# Patient Record
Sex: Female | Born: 1990
Health system: Southern US, Community
[De-identification: ages and names within clinical notes are randomized; demographics above are authoritative.]

## PROBLEM LIST (undated history)

## (undated) DIAGNOSIS — S62609A Fracture of unspecified phalanx of unspecified finger, initial encounter for closed fracture: Secondary | ICD-10-CM

## (undated) HISTORY — DX: Fracture of unspecified phalanx of unspecified finger, initial encounter for closed fracture: S62.609A

---
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
## 2012-02-05 ENCOUNTER — Telehealth: Payer: Self-pay | Admitting: Internal Medicine

## 2012-02-05 NOTE — Telephone Encounter (Signed)
Patient's mom called stating that her daughter needs a refill of her benzaclin and appt has already been scheduled for 03/29/12 at 8:30. Please assist.

## 2012-02-06 MED ORDER — CLINDAMYCIN PHOS-BENZOYL PEROX 1-5 % EX GEL: CUTANEOUS | Status: DC

## 2012-02-06 NOTE — Telephone Encounter (Signed)
Rx sent to pharmacy   

## 2012-03-26 ENCOUNTER — Encounter: Payer: Self-pay | Admitting: Internal Medicine

## 2012-03-29 ENCOUNTER — Ambulatory Visit: Payer: Self-pay | Admitting: Internal Medicine

## 2016-11-16 ENCOUNTER — Encounter: Payer: Self-pay | Admitting: Obstetrics and Gynecology

## 2016-11-20 DIAGNOSIS — R87622 Low grade squamous intraepithelial lesion on cytologic smear of vagina (LGSIL): Secondary | ICD-10-CM | POA: Insufficient documentation

## 2019-06-28 DIAGNOSIS — Z0000 Encounter for general adult medical examination without abnormal findings: Secondary | ICD-10-CM | POA: Diagnosis not present

## 2019-06-28 DIAGNOSIS — Z13220 Encounter for screening for lipoid disorders: Secondary | ICD-10-CM | POA: Diagnosis not present

## 2019-06-29 DIAGNOSIS — Z01419 Encounter for gynecological examination (general) (routine) without abnormal findings: Secondary | ICD-10-CM | POA: Diagnosis not present

## 2019-06-29 DIAGNOSIS — Z113 Encounter for screening for infections with a predominantly sexual mode of transmission: Secondary | ICD-10-CM | POA: Diagnosis not present

## 2019-07-22 DIAGNOSIS — Z20828 Contact with and (suspected) exposure to other viral communicable diseases: Secondary | ICD-10-CM | POA: Diagnosis not present

## 2019-07-22 DIAGNOSIS — U071 COVID-19: Secondary | ICD-10-CM | POA: Diagnosis not present

## 2019-08-02 DIAGNOSIS — Z20828 Contact with and (suspected) exposure to other viral communicable diseases: Secondary | ICD-10-CM | POA: Diagnosis not present

## 2019-09-13 DIAGNOSIS — H9193 Unspecified hearing loss, bilateral: Secondary | ICD-10-CM | POA: Diagnosis not present

## 2019-09-13 DIAGNOSIS — J302 Other seasonal allergic rhinitis: Secondary | ICD-10-CM | POA: Diagnosis not present

## 2019-09-13 DIAGNOSIS — J343 Hypertrophy of nasal turbinates: Secondary | ICD-10-CM | POA: Diagnosis not present

## 2019-09-13 DIAGNOSIS — J342 Deviated nasal septum: Secondary | ICD-10-CM | POA: Diagnosis not present

## 2019-10-23 DIAGNOSIS — Z20828 Contact with and (suspected) exposure to other viral communicable diseases: Secondary | ICD-10-CM | POA: Diagnosis not present

## 2019-11-10 DIAGNOSIS — J343 Hypertrophy of nasal turbinates: Secondary | ICD-10-CM | POA: Diagnosis not present

## 2019-11-10 DIAGNOSIS — L700 Acne vulgaris: Secondary | ICD-10-CM | POA: Diagnosis not present

## 2019-11-10 DIAGNOSIS — J342 Deviated nasal septum: Secondary | ICD-10-CM | POA: Diagnosis not present

## 2020-01-26 ENCOUNTER — Ambulatory Visit: Payer: Self-pay | Attending: Family

## 2020-01-26 DIAGNOSIS — Z23 Encounter for immunization: Secondary | ICD-10-CM

## 2020-01-26 NOTE — Progress Notes (Signed)
   Covid-19 Vaccination Clinic  Name:  Laurie Stout    MRN: [id number] DOB: Jan 11, 1991  01/26/2020  Laurie Stout was observed post Covid-19 immunization for 15 minutes without incident. She was provided with Vaccine Information Sheet and instruction to access the V-Safe system.   Laurie Stout was instructed to call 911 with any severe reactions post vaccine: Marland Kitchen Difficulty breathing  . Swelling of face and throat  . A fast heartbeat  . A bad rash all over body  . Dizziness and weakness   Immunizations Administered    Name Date Dose VIS Date Route   Moderna COVID-19 Vaccine 01/26/2020 11:22 AM 0.5 mL 09/27/2019 Intramuscular   Manufacturer: Moderna   Lot: [id number]   NDC: 94707-615-18

## 2020-02-07 ENCOUNTER — Other Ambulatory Visit: Payer: Self-pay

## 2020-02-08 ENCOUNTER — Encounter: Payer: Self-pay | Admitting: Nurse Practitioner

## 2020-02-08 ENCOUNTER — Ambulatory Visit (INDEPENDENT_AMBULATORY_CARE_PROVIDER_SITE_OTHER): Payer: BC Managed Care – PPO

## 2020-02-08 ENCOUNTER — Ambulatory Visit (INDEPENDENT_AMBULATORY_CARE_PROVIDER_SITE_OTHER): Payer: BC Managed Care – PPO | Admitting: Nurse Practitioner

## 2020-02-08 VITALS — BP 122/88 | HR 78 | Temp 97.3°F | Ht 66.0 in | Wt 213.0 lb

## 2020-02-08 DIAGNOSIS — N911 Secondary amenorrhea: Secondary | ICD-10-CM

## 2020-02-08 DIAGNOSIS — M545 Low back pain, unspecified: Secondary | ICD-10-CM | POA: Insufficient documentation

## 2020-02-08 DIAGNOSIS — N912 Amenorrhea, unspecified: Secondary | ICD-10-CM | POA: Insufficient documentation

## 2020-02-08 DIAGNOSIS — G8929 Other chronic pain: Secondary | ICD-10-CM | POA: Insufficient documentation

## 2020-02-08 LAB — TSH: TSH: 3.43 u[IU]/mL (ref 0.35–4.50)

## 2020-02-08 LAB — CBC WITH DIFFERENTIAL/PLATELET
Basophils Absolute: 0.1 10*3/uL (ref 0.0–0.1)
Basophils Relative: 0.8 % (ref 0.0–3.0)
Eosinophils Absolute: 0.1 10*3/uL (ref 0.0–0.7)
Eosinophils Relative: 1.9 % (ref 0.0–5.0)
HCT: 41.6 % (ref 36.0–46.0)
Hemoglobin: 14.3 g/dL (ref 12.0–15.0)
Lymphocytes Relative: 37.6 % (ref 12.0–46.0)
Lymphs Abs: 2.5 10*3/uL (ref 0.7–4.0)
MCHC: 34.3 g/dL (ref 30.0–36.0)
MCV: 82.1 fl (ref 78.0–100.0)
Monocytes Absolute: 0.6 10*3/uL (ref 0.1–1.0)
Monocytes Relative: 9.1 % (ref 3.0–12.0)
Neutro Abs: 3.4 10*3/uL (ref 1.4–7.7)
Neutrophils Relative %: 50.6 % (ref 43.0–77.0)
Platelets: 187 10*3/uL (ref 150.0–400.0)
RBC: 5.06 Mil/uL (ref 3.87–5.11)
RDW: 13.1 % (ref 11.5–15.5)
WBC: 6.6 10*3/uL (ref 4.0–10.5)

## 2020-02-08 LAB — COMPREHENSIVE METABOLIC PANEL
ALT: 39 U/L — ABNORMAL HIGH (ref 0–35)
AST: 22 U/L (ref 0–37)
Albumin: 4.6 g/dL (ref 3.5–5.2)
Alkaline Phosphatase: 57 U/L (ref 39–117)
BUN: 13 mg/dL (ref 6–23)
CO2: 26 mEq/L (ref 19–32)
Calcium: 9.3 mg/dL (ref 8.4–10.5)
Chloride: 104 mEq/L (ref 96–112)
Creatinine, Ser: 0.78 mg/dL (ref 0.40–1.20)
GFR: 88.44 mL/min (ref >60.00)
Glucose, Bld: 94 mg/dL (ref 70–99)
Potassium: 4.1 mEq/L (ref 3.5–5.1)
Sodium: 138 mEq/L (ref 135–145)
Total Bilirubin: 0.5 mg/dL (ref 0.2–1.2)
Total Protein: 6.9 g/dL (ref 6.0–8.3)

## 2020-02-08 LAB — FOLLICLE STIMULATING HORMONE: FSH: 7.5 m[IU]/mL

## 2020-02-08 LAB — PROLACTIN: Prolactin: 9 ng/mL

## 2020-02-08 LAB — LUTEINIZING HORMONE: LH: 16 m[IU]/mL

## 2020-02-08 NOTE — Progress Notes (Signed)
Subjective:  Patient ID: Laurie Stout, female    DOB: [birth year]  Age: 29 y.o. MRN: [id number]  CC: Establish Care (New Pt-Back Pain-2 months ago bend down and tearing sensation and getting worse)  Back Pain This is a chronic problem. The current episode started more than 1 year ago. The problem occurs intermittently. The problem has been waxing and waning since onset. The pain is present in the lumbar spine. The pain does not radiate. The pain is moderate. The pain is the same all the time. The symptoms are aggravated by bending and sitting. Stiffness is present all day. Pertinent negatives include no abdominal pain, bladder incontinence, bowel incontinence, dysuria, fever, headaches, leg pain, numbness, paresis, paresthesias, pelvic pain, perianal numbness, tingling, weakness or weight loss. Risk factors include obesity, lack of exercise and poor posture. She has tried muscle relaxant and NSAIDs for the symptoms. The treatment provided no relief.  denies any previous or recent back injury  She also reports absence of menstrual cycle in 89yrs, hx of irregular menstrual cycles, no hx of STD, no Hx of infertility. Has female partner, never had female partner.  Reviewed past Medical, Social and Family history today.  Outpatient Medications Prior to Visit  Medication Sig Dispense Refill  . fluticasone (FLONASE) 50 MCG/ACT nasal spray Place into the nose.    . clindamycin-benzoyl peroxide (BENZACLIN) gel Apply to face every am for acne. Pt needs to schedule a follow up appt before next refill. (Patient not taking: Reported on 02/08/2020) 50 g 0   No facility-administered medications prior to visit.    ROS See HPI  Objective:  BP 122/88   Pulse 78   Temp (!) 97.3 F (36.3 C) (Tympanic)   Ht 5\' 6"  (1.676 m)   Wt 213 lb (96.6 kg)   SpO2 98%   BMI 34.38 kg/m   BP Readings from Last 3 Encounters:  02/08/20 122/88    Wt Readings from Last 3 Encounters:  02/08/20 213 lb (96.6 kg)     Physical Exam Constitutional:      Appearance: She is obese.  Cardiovascular:     Rate and Rhythm: Normal rate and regular rhythm.     Pulses: Normal pulses.  Pulmonary:     Effort: Pulmonary effort is normal.  Abdominal:     General: Bowel sounds are normal. There is no distension.     Palpations: Abdomen is soft.     Tenderness: There is no abdominal tenderness.  Musculoskeletal:        General: Tenderness present. No swelling.     Thoracic back: Normal.     Lumbar back: Tenderness and bony tenderness present. Decreased range of motion. Negative right straight leg raise test and negative left straight leg raise test. No scoliosis.     Right hip: Normal.     Left hip: Normal.     Right lower leg: Normal. No edema.     Left lower leg: Normal. No edema.  Skin:    General: Skin is warm and dry.     Findings: No erythema or rash.  Neurological:     Mental Status: She is alert.    Lab Results  Component Value Date   WBC 6.6 02/08/2020   HGB 14.3 02/08/2020   HCT 41.6 02/08/2020   PLT 187.0 02/08/2020   GLUCOSE 94 02/08/2020   ALT 39 (H) 02/08/2020   AST 22 02/08/2020   NA 138 02/08/2020   K 4.1 02/08/2020   CL 104 02/08/2020  CREATININE 0.78 02/08/2020   BUN 13 02/08/2020   CO2 26 02/08/2020   TSH 3.43 02/08/2020    Assessment & Plan:  This visit occurred during the SARS-CoV-2 public health emergency.  Safety protocols were in place, including screening questions prior to the visit, additional usage of staff PPE, and extensive cleaning of exam room while observing appropriate contact time as indicated for disinfecting solutions.   Yadira was seen today for establish care.  Diagnoses and all orders for this visit:  Chronic bilateral low back pain without sciatica -     DG Lumbar Spine 2-3 Views -     cyclobenzaprine (FLEXERIL) 10 MG tablet; Take 1 tablet (10 mg total) by mouth at bedtime. -     predniSONE (DELTASONE) 20 MG tablet; Take 1.5 tablets (30 mg  total) by mouth daily with breakfast for 2 days, THEN 1 tablet (20 mg total) daily with breakfast for 3 days, THEN 0.5 tablets (10 mg total) daily with breakfast for 2 days. -     Ambulatory referral to Physical Therapy  Secondary amenorrhea -     CBC w/Diff -     Comprehensive metabolic panel -     TSH -     Prolactin -     FSH -     LH -     US Pelvic Complete With Transvaginal; Future   I have discontinued Marline Backbone Mandala's clindamycin-benzoyl peroxide. I am also having her start on cyclobenzaprine and predniSONE. Additionally, I am having her maintain her fluticasone.  Meds ordered this encounter  Medications  . cyclobenzaprine (FLEXERIL) 10 MG tablet    Sig: Take 1 tablet (10 mg total) by mouth at bedtime.    Dispense:  20 tablet    Refill:  0    Order Specific Question:   Supervising Provider    Answer:   Overton Mam [[id number]]  . predniSONE (DELTASONE) 20 MG tablet    Sig: Take 1.5 tablets (30 mg total) by mouth daily with breakfast for 2 days, THEN 1 tablet (20 mg total) daily with breakfast for 3 days, THEN 0.5 tablets (10 mg total) daily with breakfast for 2 days.    Dispense:  7 tablet    Refill:  0    Order Specific Question:   Supervising Provider    Answer:   Overton Mam [[id number]]    Problem List Items Addressed This Visit      Other   Amenorrhea    absence of menstrual cycle in 64yrs, hx of irregular menstrual cycles, no hx of STD, no FHx of infertility. Has female partner, never had female partner.  Normal TSH, prolactin, LH, FSH, and CMP. Ordered pelvic US Entered referral to GYN      Chronic bilateral low back pain without sciatica - Primary    Chronic, waxing and waning, worse in last 73months, no radiculopathy. Normal Lumbar DG Sent oral prednisone and flexeril Entered referral to PT      Relevant Medications   cyclobenzaprine (FLEXERIL) 10 MG tablet   predniSONE (DELTASONE) 20 MG tablet   Other Relevant Orders   DG Lumbar Spine 2-3  Views (Completed)   Ambulatory referral to Physical Therapy       Follow-up: Return if symptoms worsen or fail to improve.  Alysia Penna, NP

## 2020-02-08 NOTE — Patient Instructions (Signed)
Go to lab for blood draw and back x-ray  You will be contacted with results.

## 2020-02-09 MED ORDER — CYCLOBENZAPRINE HCL 10 MG PO TABS: 10.0000 mg | ORAL_TABLET | Freq: Every day | ORAL | 0 refills | Status: AC

## 2020-02-09 MED ORDER — PREDNISONE 20 MG PO TABS: ORAL_TABLET | ORAL | 0 refills | Status: AC

## 2020-02-09 NOTE — Assessment & Plan Note (Signed)
Chronic, waxing and waning, worse in last 10months, no radiculopathy. Normal Lumbar DG Sent oral prednisone and flexeril Entered referral to PT

## 2020-02-09 NOTE — Assessment & Plan Note (Signed)
absence of menstrual cycle in 46yrs, hx of irregular menstrual cycles, no hx of STD, no FHx of infertility. Has female partner, never had female partner.  Normal TSH, prolactin, LH, FSH, and CMP. Ordered pelvic US Entered referral to GYN

## 2020-02-15 ENCOUNTER — Ambulatory Visit
Admission: RE | Admit: 2020-02-15 | Discharge: 2020-02-15 | Disposition: A | Discharge status: Home / Self Care | Payer: BC Managed Care – PPO | Source: Ambulatory Visit | Attending: Nurse Practitioner | Admitting: Nurse Practitioner

## 2020-02-15 DIAGNOSIS — N858 Other specified noninflammatory disorders of uterus: Secondary | ICD-10-CM | POA: Diagnosis not present

## 2020-02-15 DIAGNOSIS — N911 Secondary amenorrhea: Secondary | ICD-10-CM

## 2020-02-18 ENCOUNTER — Other Ambulatory Visit: Payer: Self-pay | Admitting: Nurse Practitioner

## 2020-02-18 DIAGNOSIS — E282 Polycystic ovarian syndrome: Secondary | ICD-10-CM

## 2020-02-18 DIAGNOSIS — N912 Amenorrhea, unspecified: Secondary | ICD-10-CM

## 2020-02-22 ENCOUNTER — Ambulatory Visit: Payer: BC Managed Care – PPO | Attending: Nurse Practitioner | Admitting: Physical Therapy

## 2020-02-28 ENCOUNTER — Ambulatory Visit: Payer: Self-pay

## 2020-03-02 DIAGNOSIS — Z021 Encounter for pre-employment examination: Secondary | ICD-10-CM | POA: Diagnosis not present

## 2020-03-02 DIAGNOSIS — Z1159 Encounter for screening for other viral diseases: Secondary | ICD-10-CM | POA: Diagnosis not present

## 2020-03-02 DIAGNOSIS — Z111 Encounter for screening for respiratory tuberculosis: Secondary | ICD-10-CM | POA: Diagnosis not present

## 2020-03-06 ENCOUNTER — Ambulatory Visit: Payer: BC Managed Care – PPO | Attending: Family

## 2020-03-06 DIAGNOSIS — Z23 Encounter for immunization: Secondary | ICD-10-CM

## 2020-03-06 NOTE — Progress Notes (Signed)
   Covid-19 Vaccination Clinic  Name:  Laurie Stout    MRN: [id number] DOB: [birth year]  03/06/2020  [name] was observed post Covid-19 immunization for 15 minutes without incident. She was provided with Vaccine Information Sheet and instruction to access the V-Safe system.   [name] was instructed to call 911 with any severe reactions post vaccine: Marland Kitchen Difficulty breathing  . Swelling of face and throat  . A fast heartbeat  . A bad rash all over body  . Dizziness and weakness   Immunizations Administered    Name Date Dose VIS Date Route   Moderna COVID-19 Vaccine 03/06/2020 11:33 AM 0.5 mL 09/2019 Intramuscular   Manufacturer: Moderna   Lot: [id number]   NDC: 99833-825-05

## 2020-03-14 ENCOUNTER — Encounter: Payer: Self-pay | Admitting: Obstetrics and Gynecology

## 2020-03-14 ENCOUNTER — Other Ambulatory Visit: Payer: Self-pay

## 2020-03-14 ENCOUNTER — Other Ambulatory Visit (HOSPITAL_COMMUNITY)
Admission: RE | Admit: 2020-03-14 | Discharge: 2020-03-14 | Disposition: A | Discharge status: Home / Self Care | Payer: BC Managed Care – PPO | Source: Ambulatory Visit | Attending: Obstetrics and Gynecology | Admitting: Obstetrics and Gynecology

## 2020-03-14 ENCOUNTER — Ambulatory Visit: Payer: BC Managed Care – PPO | Admitting: Obstetrics and Gynecology

## 2020-03-14 VITALS — BP 132/89 | HR 68 | Ht 66.0 in | Wt 210.3 lb

## 2020-03-14 DIAGNOSIS — Z113 Encounter for screening for infections with a predominantly sexual mode of transmission: Secondary | ICD-10-CM | POA: Insufficient documentation

## 2020-03-14 DIAGNOSIS — Z01419 Encounter for gynecological examination (general) (routine) without abnormal findings: Secondary | ICD-10-CM | POA: Diagnosis not present

## 2020-03-14 DIAGNOSIS — N898 Other specified noninflammatory disorders of vagina: Secondary | ICD-10-CM

## 2020-03-14 DIAGNOSIS — N911 Secondary amenorrhea: Secondary | ICD-10-CM

## 2020-03-14 MED ORDER — MEDROXYPROGESTERONE ACETATE 10 MG PO TABS: 10.0000 mg | ORAL_TABLET | Freq: Every day | ORAL | 0 refills | Status: AC

## 2020-03-14 NOTE — Patient Instructions (Signed)
Please call and let me know if you have a period after the 10 days of progesterone.

## 2020-03-14 NOTE — Progress Notes (Signed)
Pt is here as new patient for initial visit. Referral from [facility] at [facility].   Pt reports that she has not had a menstrual cycle in over 2 years. Pt reports that her menstrual cycles have never been normal. Pt had [place] and blood work in April.   Pt reports she is up to date on pap smear, last one 2020 pt reports as normal.
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
## 2020-03-15 LAB — CERVICOVAGINAL ANCILLARY ONLY
Bacterial Vaginitis (gardnerella): NEGATIVE
Candida Glabrata: NEGATIVE
Candida Vaginitis: NEGATIVE
Chlamydia: NEGATIVE
Comment: NEGATIVE
Comment: NEGATIVE
Comment: NEGATIVE
Comment: NEGATIVE
Comment: NEGATIVE
Comment: NORMAL
Neisseria Gonorrhea: NEGATIVE
Trichomonas: NEGATIVE

## 2020-03-21 LAB — HEMOGLOBIN A1C
Est. average glucose Bld gHb Est-mCnc: 105 mg/dL
Hgb A1c MFr Bld: 5.3 % (ref 4.8–5.6)

## 2020-03-21 LAB — HEPATITIS B SURFACE ANTIGEN: Hepatitis B Surface Ag: NEGATIVE

## 2020-03-21 LAB — TESTOSTERONE,FREE AND TOTAL
Testosterone, Free: 3.7 pg/mL (ref 0.0–4.2)
Testosterone: 28 ng/dL (ref 13–71)

## 2020-03-21 LAB — ESTRADIOL: Estradiol: 34.7 pg/mL

## 2020-03-21 LAB — HEPATITIS C ANTIBODY: Hep C Virus Ab: <0.1 s/co ratio (ref 0.0–0.9)

## 2020-03-21 LAB — DHEA-SULFATE: DHEA-SO4: 145 ug/dL (ref 84.8–378.0)

## 2020-03-21 LAB — RPR: RPR Ser Ql: NONREACTIVE

## 2020-03-21 LAB — BETA HCG QUANT (REF LAB): hCG Quant: <1 m[IU]/mL

## 2020-03-21 LAB — HIV ANTIBODY (ROUTINE TESTING W REFLEX): HIV Screen 4th Generation wRfx: NONREACTIVE

## 2020-03-21 LAB — 17-HYDROXYPROGESTERONE: 17-Hydroxyprogesterone: 55 ng/dL

## 2020-03-28 ENCOUNTER — Other Ambulatory Visit: Payer: Self-pay | Admitting: Obstetrics and Gynecology

## 2020-03-28 MED ORDER — NORGESTIMATE-ETH ESTRADIOL 0.25-35 MG-MCG PO TABS: 1.0000 | ORAL_TABLET | Freq: Every day | ORAL | 11 refills | Status: AC

## 2020-03-28 NOTE — Progress Notes (Signed)
Sprintec sent to pharmacy.

## 2020-04-02 DIAGNOSIS — Z23 Encounter for immunization: Secondary | ICD-10-CM | POA: Diagnosis not present

## 2020-07-04 DIAGNOSIS — Z20828 Contact with and (suspected) exposure to other viral communicable diseases: Secondary | ICD-10-CM | POA: Diagnosis not present

## 2020-07-11 DIAGNOSIS — M41122 Adolescent idiopathic scoliosis, cervical region: Secondary | ICD-10-CM | POA: Diagnosis not present

## 2020-07-11 DIAGNOSIS — M9903 Segmental and somatic dysfunction of lumbar region: Secondary | ICD-10-CM | POA: Diagnosis not present

## 2020-07-11 DIAGNOSIS — M5137 Other intervertebral disc degeneration, lumbosacral region: Secondary | ICD-10-CM | POA: Diagnosis not present

## 2020-07-11 DIAGNOSIS — G44229 Chronic tension-type headache, not intractable: Secondary | ICD-10-CM | POA: Diagnosis not present

## 2020-07-12 DIAGNOSIS — M41122 Adolescent idiopathic scoliosis, cervical region: Secondary | ICD-10-CM | POA: Diagnosis not present

## 2020-07-12 DIAGNOSIS — M9903 Segmental and somatic dysfunction of lumbar region: Secondary | ICD-10-CM | POA: Diagnosis not present

## 2020-07-12 DIAGNOSIS — M5137 Other intervertebral disc degeneration, lumbosacral region: Secondary | ICD-10-CM | POA: Diagnosis not present

## 2020-07-12 DIAGNOSIS — G44229 Chronic tension-type headache, not intractable: Secondary | ICD-10-CM | POA: Diagnosis not present

## 2020-07-16 DIAGNOSIS — G44229 Chronic tension-type headache, not intractable: Secondary | ICD-10-CM | POA: Diagnosis not present

## 2020-07-16 DIAGNOSIS — M9903 Segmental and somatic dysfunction of lumbar region: Secondary | ICD-10-CM | POA: Diagnosis not present

## 2020-07-16 DIAGNOSIS — M41122 Adolescent idiopathic scoliosis, cervical region: Secondary | ICD-10-CM | POA: Diagnosis not present

## 2020-07-16 DIAGNOSIS — M5137 Other intervertebral disc degeneration, lumbosacral region: Secondary | ICD-10-CM | POA: Diagnosis not present

## 2020-07-18 DIAGNOSIS — M9903 Segmental and somatic dysfunction of lumbar region: Secondary | ICD-10-CM | POA: Diagnosis not present

## 2020-07-18 DIAGNOSIS — G44229 Chronic tension-type headache, not intractable: Secondary | ICD-10-CM | POA: Diagnosis not present

## 2020-07-18 DIAGNOSIS — M5137 Other intervertebral disc degeneration, lumbosacral region: Secondary | ICD-10-CM | POA: Diagnosis not present

## 2020-07-18 DIAGNOSIS — M41122 Adolescent idiopathic scoliosis, cervical region: Secondary | ICD-10-CM | POA: Diagnosis not present

## 2020-07-20 DIAGNOSIS — G44229 Chronic tension-type headache, not intractable: Secondary | ICD-10-CM | POA: Diagnosis not present

## 2020-07-20 DIAGNOSIS — M9903 Segmental and somatic dysfunction of lumbar region: Secondary | ICD-10-CM | POA: Diagnosis not present

## 2020-07-20 DIAGNOSIS — M5137 Other intervertebral disc degeneration, lumbosacral region: Secondary | ICD-10-CM | POA: Diagnosis not present

## 2020-07-20 DIAGNOSIS — M41122 Adolescent idiopathic scoliosis, cervical region: Secondary | ICD-10-CM | POA: Diagnosis not present

## 2020-07-23 DIAGNOSIS — M9903 Segmental and somatic dysfunction of lumbar region: Secondary | ICD-10-CM | POA: Diagnosis not present

## 2020-07-23 DIAGNOSIS — M5137 Other intervertebral disc degeneration, lumbosacral region: Secondary | ICD-10-CM | POA: Diagnosis not present

## 2020-07-23 DIAGNOSIS — G44229 Chronic tension-type headache, not intractable: Secondary | ICD-10-CM | POA: Diagnosis not present

## 2020-07-23 DIAGNOSIS — M41122 Adolescent idiopathic scoliosis, cervical region: Secondary | ICD-10-CM | POA: Diagnosis not present

## 2020-07-25 DIAGNOSIS — Z23 Encounter for immunization: Secondary | ICD-10-CM | POA: Diagnosis not present

## 2020-07-25 DIAGNOSIS — G44229 Chronic tension-type headache, not intractable: Secondary | ICD-10-CM | POA: Diagnosis not present

## 2020-07-25 DIAGNOSIS — M5137 Other intervertebral disc degeneration, lumbosacral region: Secondary | ICD-10-CM | POA: Diagnosis not present

## 2020-07-25 DIAGNOSIS — M9903 Segmental and somatic dysfunction of lumbar region: Secondary | ICD-10-CM | POA: Diagnosis not present

## 2020-07-25 DIAGNOSIS — M41122 Adolescent idiopathic scoliosis, cervical region: Secondary | ICD-10-CM | POA: Diagnosis not present

## 2020-07-27 DIAGNOSIS — M9903 Segmental and somatic dysfunction of lumbar region: Secondary | ICD-10-CM | POA: Diagnosis not present

## 2020-07-27 DIAGNOSIS — M41122 Adolescent idiopathic scoliosis, cervical region: Secondary | ICD-10-CM | POA: Diagnosis not present

## 2020-07-27 DIAGNOSIS — G44229 Chronic tension-type headache, not intractable: Secondary | ICD-10-CM | POA: Diagnosis not present

## 2020-07-27 DIAGNOSIS — M5137 Other intervertebral disc degeneration, lumbosacral region: Secondary | ICD-10-CM | POA: Diagnosis not present

## 2020-07-30 DIAGNOSIS — G44229 Chronic tension-type headache, not intractable: Secondary | ICD-10-CM | POA: Diagnosis not present

## 2020-07-30 DIAGNOSIS — M5137 Other intervertebral disc degeneration, lumbosacral region: Secondary | ICD-10-CM | POA: Diagnosis not present

## 2020-07-30 DIAGNOSIS — M41122 Adolescent idiopathic scoliosis, cervical region: Secondary | ICD-10-CM | POA: Diagnosis not present

## 2020-07-30 DIAGNOSIS — M9903 Segmental and somatic dysfunction of lumbar region: Secondary | ICD-10-CM | POA: Diagnosis not present

## 2020-08-02 DIAGNOSIS — M41122 Adolescent idiopathic scoliosis, cervical region: Secondary | ICD-10-CM | POA: Diagnosis not present

## 2020-08-02 DIAGNOSIS — M9903 Segmental and somatic dysfunction of lumbar region: Secondary | ICD-10-CM | POA: Diagnosis not present

## 2020-08-02 DIAGNOSIS — M5137 Other intervertebral disc degeneration, lumbosacral region: Secondary | ICD-10-CM | POA: Diagnosis not present

## 2020-08-02 DIAGNOSIS — G44229 Chronic tension-type headache, not intractable: Secondary | ICD-10-CM | POA: Diagnosis not present

## 2020-08-03 DIAGNOSIS — M9903 Segmental and somatic dysfunction of lumbar region: Secondary | ICD-10-CM | POA: Diagnosis not present

## 2020-08-03 DIAGNOSIS — M5137 Other intervertebral disc degeneration, lumbosacral region: Secondary | ICD-10-CM | POA: Diagnosis not present

## 2020-08-03 DIAGNOSIS — M41122 Adolescent idiopathic scoliosis, cervical region: Secondary | ICD-10-CM | POA: Diagnosis not present

## 2020-08-03 DIAGNOSIS — G44229 Chronic tension-type headache, not intractable: Secondary | ICD-10-CM | POA: Diagnosis not present

## 2020-08-06 DIAGNOSIS — M5137 Other intervertebral disc degeneration, lumbosacral region: Secondary | ICD-10-CM | POA: Diagnosis not present

## 2020-08-06 DIAGNOSIS — G44229 Chronic tension-type headache, not intractable: Secondary | ICD-10-CM | POA: Diagnosis not present

## 2020-08-06 DIAGNOSIS — M41122 Adolescent idiopathic scoliosis, cervical region: Secondary | ICD-10-CM | POA: Diagnosis not present

## 2020-08-06 DIAGNOSIS — M9903 Segmental and somatic dysfunction of lumbar region: Secondary | ICD-10-CM | POA: Diagnosis not present

## 2020-08-08 DIAGNOSIS — M5137 Other intervertebral disc degeneration, lumbosacral region: Secondary | ICD-10-CM | POA: Diagnosis not present

## 2020-08-08 DIAGNOSIS — M41122 Adolescent idiopathic scoliosis, cervical region: Secondary | ICD-10-CM | POA: Diagnosis not present

## 2020-08-08 DIAGNOSIS — M9903 Segmental and somatic dysfunction of lumbar region: Secondary | ICD-10-CM | POA: Diagnosis not present

## 2020-08-08 DIAGNOSIS — G44229 Chronic tension-type headache, not intractable: Secondary | ICD-10-CM | POA: Diagnosis not present

## 2020-08-13 DIAGNOSIS — G44229 Chronic tension-type headache, not intractable: Secondary | ICD-10-CM | POA: Diagnosis not present

## 2020-08-13 DIAGNOSIS — M9903 Segmental and somatic dysfunction of lumbar region: Secondary | ICD-10-CM | POA: Diagnosis not present

## 2020-08-13 DIAGNOSIS — M5137 Other intervertebral disc degeneration, lumbosacral region: Secondary | ICD-10-CM | POA: Diagnosis not present

## 2020-08-13 DIAGNOSIS — M41122 Adolescent idiopathic scoliosis, cervical region: Secondary | ICD-10-CM | POA: Diagnosis not present

## 2020-08-17 DIAGNOSIS — M9905 Segmental and somatic dysfunction of pelvic region: Secondary | ICD-10-CM | POA: Diagnosis not present

## 2020-08-17 DIAGNOSIS — M9903 Segmental and somatic dysfunction of lumbar region: Secondary | ICD-10-CM | POA: Diagnosis not present

## 2020-08-17 DIAGNOSIS — M9901 Segmental and somatic dysfunction of cervical region: Secondary | ICD-10-CM | POA: Diagnosis not present

## 2020-08-17 DIAGNOSIS — M9902 Segmental and somatic dysfunction of thoracic region: Secondary | ICD-10-CM | POA: Diagnosis not present

## 2020-08-21 DIAGNOSIS — M9902 Segmental and somatic dysfunction of thoracic region: Secondary | ICD-10-CM | POA: Diagnosis not present

## 2020-08-21 DIAGNOSIS — M546 Pain in thoracic spine: Secondary | ICD-10-CM | POA: Diagnosis not present

## 2020-08-21 DIAGNOSIS — M9905 Segmental and somatic dysfunction of pelvic region: Secondary | ICD-10-CM | POA: Diagnosis not present

## 2020-08-21 DIAGNOSIS — M9901 Segmental and somatic dysfunction of cervical region: Secondary | ICD-10-CM | POA: Diagnosis not present

## 2020-08-21 DIAGNOSIS — M9903 Segmental and somatic dysfunction of lumbar region: Secondary | ICD-10-CM | POA: Diagnosis not present

## 2020-08-23 DIAGNOSIS — M9902 Segmental and somatic dysfunction of thoracic region: Secondary | ICD-10-CM | POA: Diagnosis not present

## 2020-08-23 DIAGNOSIS — M546 Pain in thoracic spine: Secondary | ICD-10-CM | POA: Diagnosis not present

## 2020-08-23 DIAGNOSIS — M9903 Segmental and somatic dysfunction of lumbar region: Secondary | ICD-10-CM | POA: Diagnosis not present

## 2020-08-23 DIAGNOSIS — M9905 Segmental and somatic dysfunction of pelvic region: Secondary | ICD-10-CM | POA: Diagnosis not present

## 2020-08-23 DIAGNOSIS — M9901 Segmental and somatic dysfunction of cervical region: Secondary | ICD-10-CM | POA: Diagnosis not present

## 2020-08-27 DIAGNOSIS — M546 Pain in thoracic spine: Secondary | ICD-10-CM | POA: Diagnosis not present

## 2020-08-27 DIAGNOSIS — M9905 Segmental and somatic dysfunction of pelvic region: Secondary | ICD-10-CM | POA: Diagnosis not present

## 2020-08-27 DIAGNOSIS — M9902 Segmental and somatic dysfunction of thoracic region: Secondary | ICD-10-CM | POA: Diagnosis not present

## 2020-08-27 DIAGNOSIS — M9903 Segmental and somatic dysfunction of lumbar region: Secondary | ICD-10-CM | POA: Diagnosis not present

## 2020-08-27 DIAGNOSIS — M9901 Segmental and somatic dysfunction of cervical region: Secondary | ICD-10-CM | POA: Diagnosis not present

## 2020-08-29 DIAGNOSIS — M9901 Segmental and somatic dysfunction of cervical region: Secondary | ICD-10-CM | POA: Diagnosis not present

## 2020-08-29 DIAGNOSIS — M546 Pain in thoracic spine: Secondary | ICD-10-CM | POA: Diagnosis not present

## 2020-08-29 DIAGNOSIS — M9903 Segmental and somatic dysfunction of lumbar region: Secondary | ICD-10-CM | POA: Diagnosis not present

## 2020-08-29 DIAGNOSIS — M9905 Segmental and somatic dysfunction of pelvic region: Secondary | ICD-10-CM | POA: Diagnosis not present

## 2020-08-29 DIAGNOSIS — M9902 Segmental and somatic dysfunction of thoracic region: Secondary | ICD-10-CM | POA: Diagnosis not present

## 2020-09-03 DIAGNOSIS — M41122 Adolescent idiopathic scoliosis, cervical region: Secondary | ICD-10-CM | POA: Diagnosis not present

## 2020-09-03 DIAGNOSIS — M9905 Segmental and somatic dysfunction of pelvic region: Secondary | ICD-10-CM | POA: Diagnosis not present

## 2020-09-03 DIAGNOSIS — M9901 Segmental and somatic dysfunction of cervical region: Secondary | ICD-10-CM | POA: Diagnosis not present

## 2020-09-03 DIAGNOSIS — M9903 Segmental and somatic dysfunction of lumbar region: Secondary | ICD-10-CM | POA: Diagnosis not present

## 2020-09-03 DIAGNOSIS — M9902 Segmental and somatic dysfunction of thoracic region: Secondary | ICD-10-CM | POA: Diagnosis not present

## 2020-11-11 DIAGNOSIS — Z20822 Contact with and (suspected) exposure to covid-19: Secondary | ICD-10-CM | POA: Diagnosis not present

## 2020-12-03 DIAGNOSIS — J3489 Other specified disorders of nose and nasal sinuses: Secondary | ICD-10-CM | POA: Diagnosis not present

## 2020-12-03 DIAGNOSIS — J342 Deviated nasal septum: Secondary | ICD-10-CM | POA: Diagnosis not present

## 2020-12-03 DIAGNOSIS — J343 Hypertrophy of nasal turbinates: Secondary | ICD-10-CM | POA: Diagnosis not present

## 2020-12-03 DIAGNOSIS — M950 Acquired deformity of nose: Secondary | ICD-10-CM | POA: Diagnosis not present

## 2020-12-20 ENCOUNTER — Ambulatory Visit (INDEPENDENT_AMBULATORY_CARE_PROVIDER_SITE_OTHER): Payer: BC Managed Care – PPO | Admitting: Otolaryngology

## 2021-01-23 DIAGNOSIS — H903 Sensorineural hearing loss, bilateral: Secondary | ICD-10-CM | POA: Diagnosis not present

## 2021-01-23 DIAGNOSIS — Z9889 Other specified postprocedural states: Secondary | ICD-10-CM | POA: Diagnosis not present

## 2021-01-23 DIAGNOSIS — H9313 Tinnitus, bilateral: Secondary | ICD-10-CM | POA: Insufficient documentation

## 2021-01-23 DIAGNOSIS — J302 Other seasonal allergic rhinitis: Secondary | ICD-10-CM | POA: Diagnosis not present

## 2021-03-14 DIAGNOSIS — Z8709 Personal history of other diseases of the respiratory system: Secondary | ICD-10-CM | POA: Diagnosis not present

## 2021-03-14 DIAGNOSIS — Z8739 Personal history of other diseases of the musculoskeletal system and connective tissue: Secondary | ICD-10-CM | POA: Diagnosis not present

## 2021-03-14 DIAGNOSIS — Z09 Encounter for follow-up examination after completed treatment for conditions other than malignant neoplasm: Secondary | ICD-10-CM | POA: Diagnosis not present

## 2021-05-07 DIAGNOSIS — Z111 Encounter for screening for respiratory tuberculosis: Secondary | ICD-10-CM | POA: Diagnosis not present

## 2021-07-31 DIAGNOSIS — Z23 Encounter for immunization: Secondary | ICD-10-CM | POA: Diagnosis not present

## 2021-09-06 DIAGNOSIS — Z20822 Contact with and (suspected) exposure to covid-19: Secondary | ICD-10-CM | POA: Diagnosis not present

## 2021-09-06 DIAGNOSIS — R5381 Other malaise: Secondary | ICD-10-CM | POA: Diagnosis not present

## 2022-02-04 IMAGING — DX DG LUMBAR SPINE 2-3V
5 series · 5 of 5 positions shown · non-contrast
Comparison: None.

CLINICAL DATA: Chronic low back pain for 2 years

EXAM:
LUMBAR SPINE - 2-3 VIEW

[lumbar spine ap]
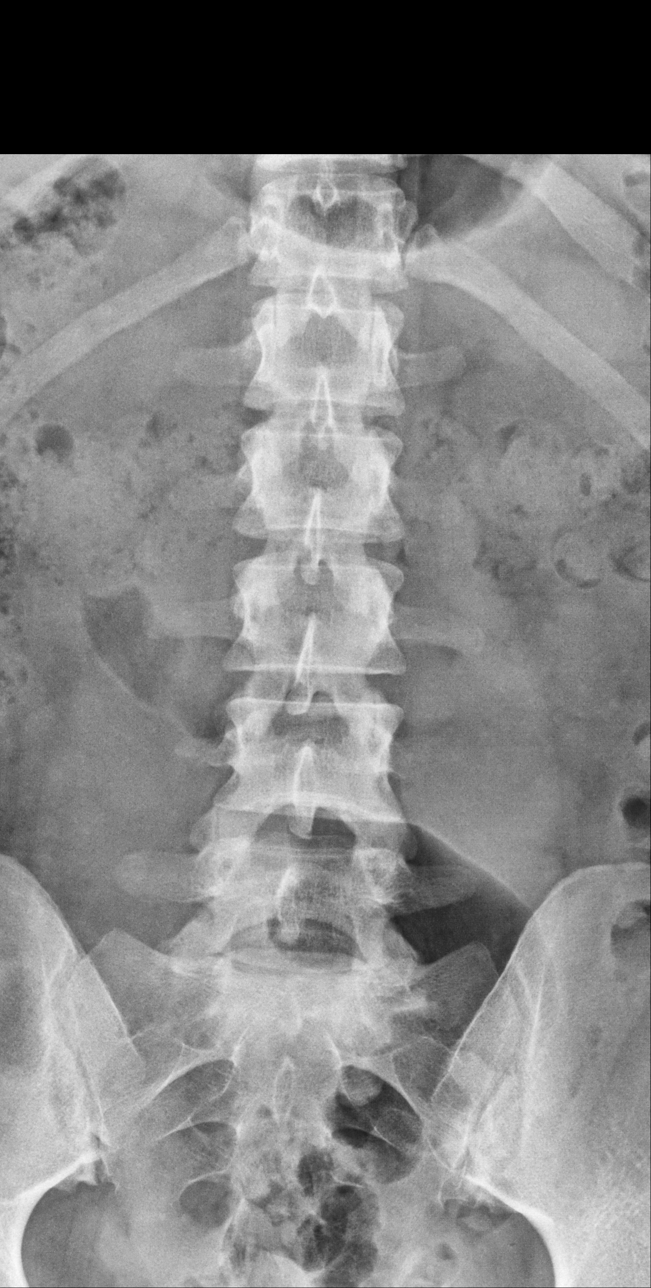

[lumbar spine lmo (1 of 2)]
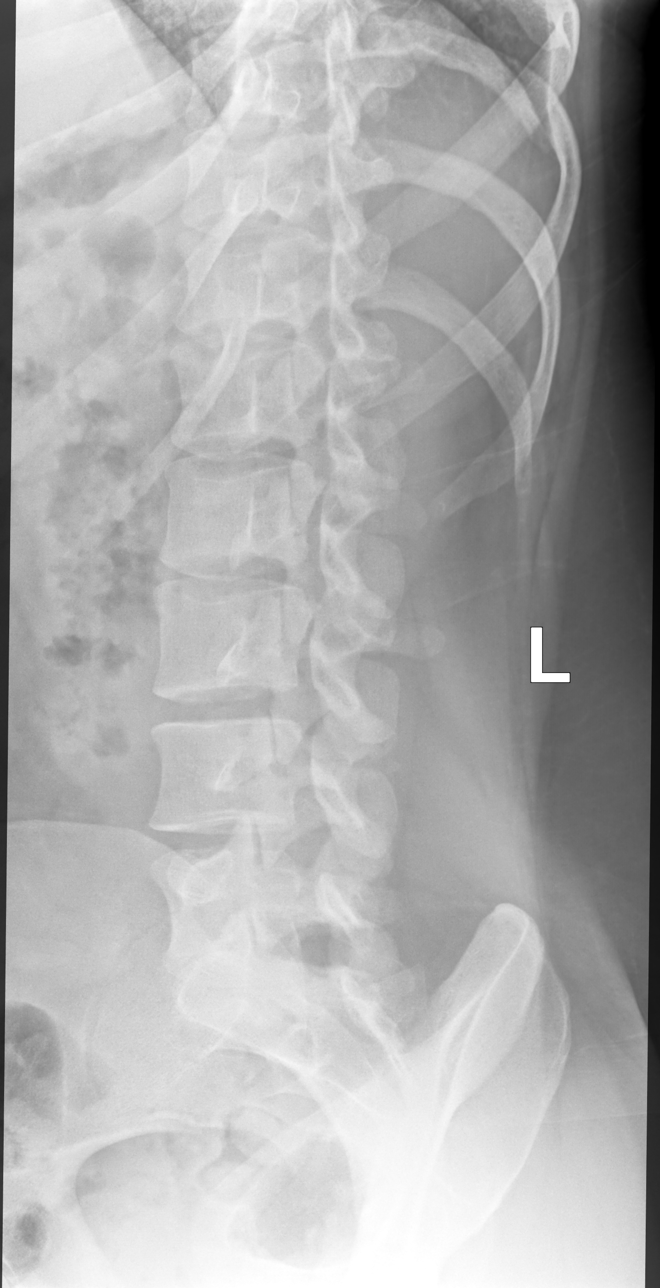

[lumbar spine lmo (2 of 2)]
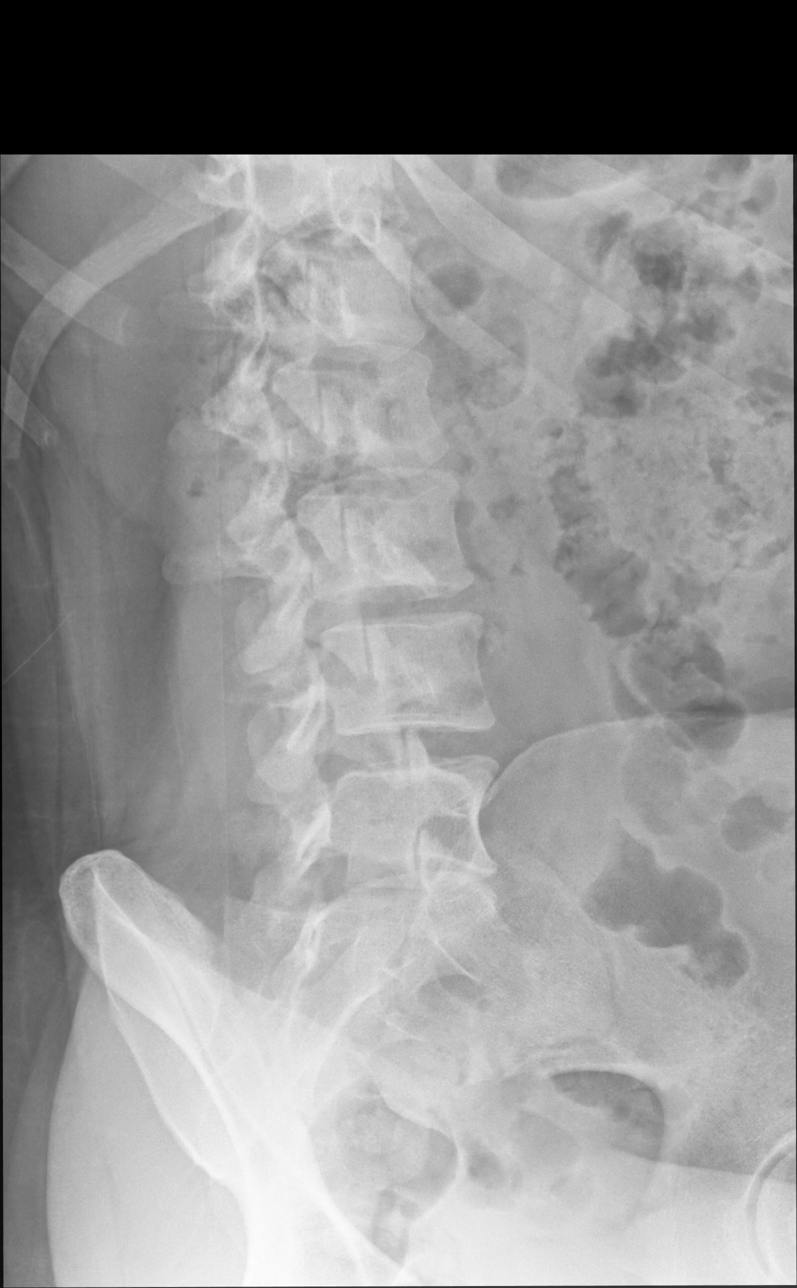

[lumbar spine lat (1 of 2)]
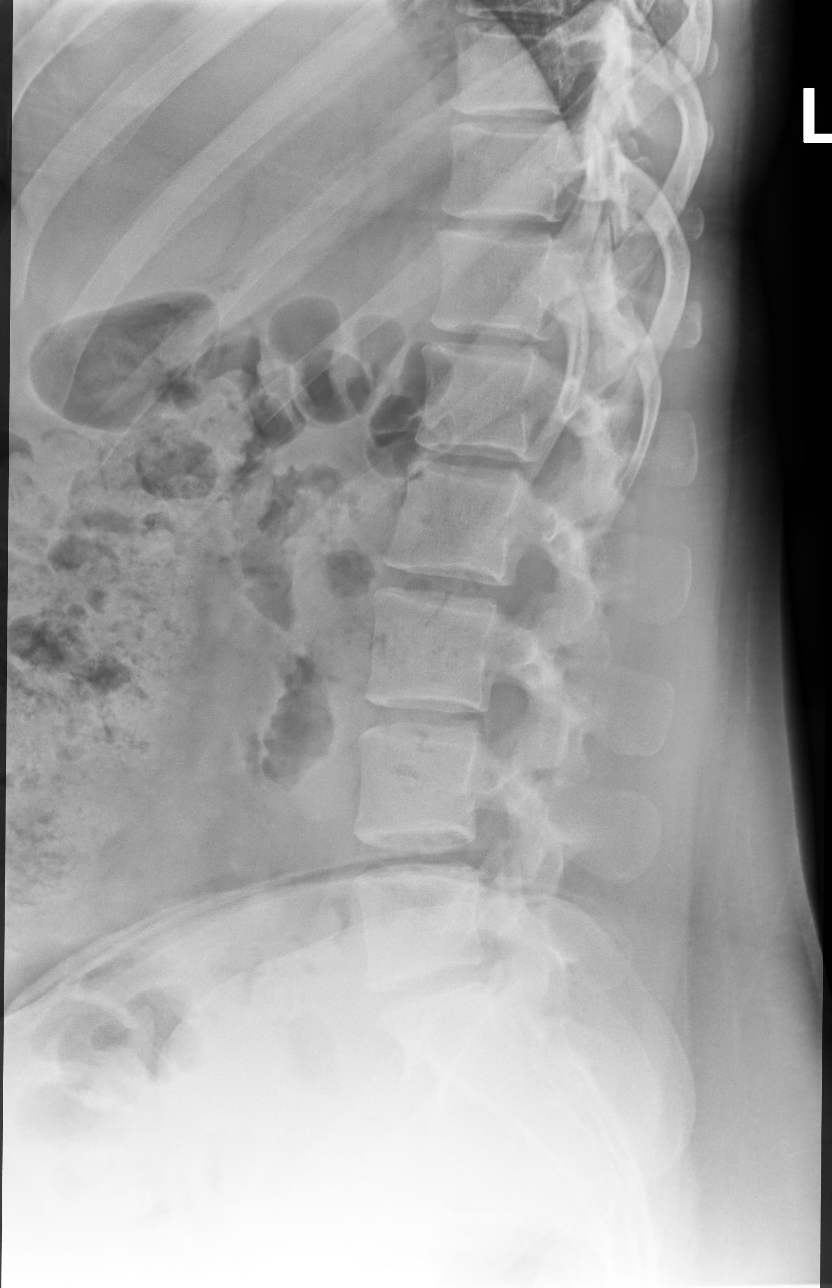

[lumbar spine lat (2 of 2)]
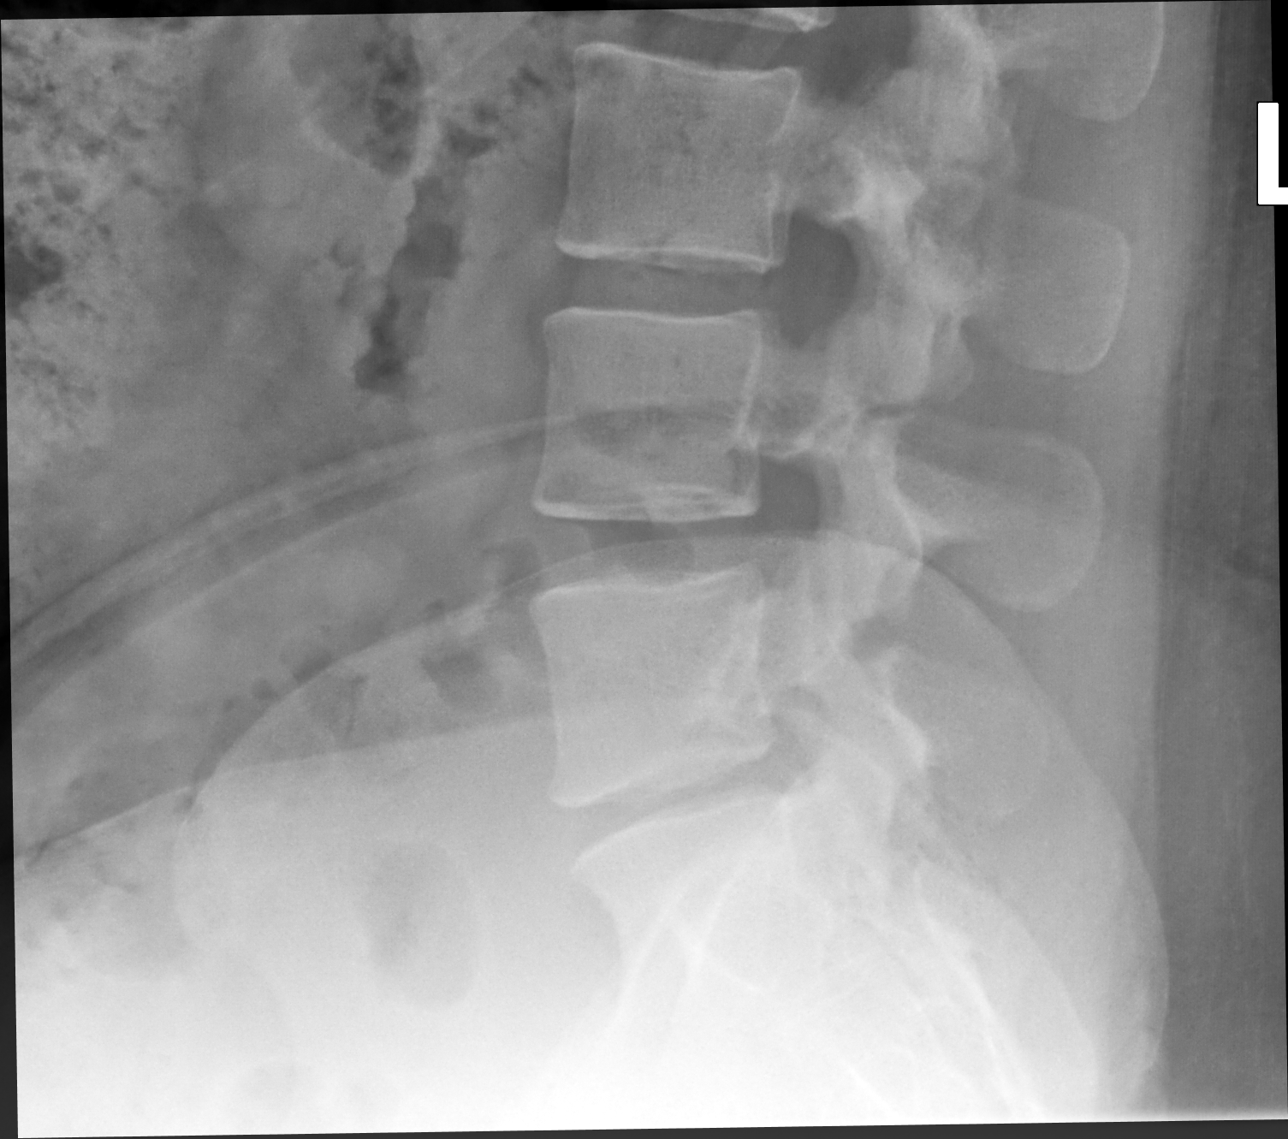

[5 of 5 positions shown; findings below may reference images not displayed]

FINDINGS: There is no evidence of lumbar spine fracture. Alignment is normal.
Intervertebral disc spaces are maintained.
IMPRESSION: No fracture or dislocation of the lumbar spine. Disc spaces and
vertebral body heights are preserved.

## 2023-02-21 ENCOUNTER — Ambulatory Visit
Admission: RE | Admit: 2023-02-21 | Discharge: 2023-02-21 | Disposition: A | Discharge status: Home / Self Care | Payer: Commercial Managed Care - PPO | Source: Ambulatory Visit

## 2023-02-21 VITALS — BP 120/84 | HR 74 | Temp 98.0°F | Resp 18

## 2023-02-21 DIAGNOSIS — J018 Other acute sinusitis: Secondary | ICD-10-CM

## 2023-02-21 DIAGNOSIS — J309 Allergic rhinitis, unspecified: Secondary | ICD-10-CM | POA: Diagnosis not present

## 2023-02-21 MED ORDER — PREDNISONE 20 MG PO TABS: ORAL_TABLET | ORAL | 0 refills | Status: AC

## 2023-02-21 MED ORDER — AMOXICILLIN-POT CLAVULANATE 875-125 MG PO TABS: 1.0000 | ORAL_TABLET | Freq: Two times a day (BID) | ORAL | 0 refills | Status: AC

## 2023-02-21 NOTE — ED Triage Notes (Signed)
Pt reports nasal congestion, sinus pressure, sore throat, hearing loss x 4 weeks. Dayquil, Nyquil gives relief temporarily.

## 2023-02-21 NOTE — Discharge Instructions (Signed)
We will manage this as a sinus infection with Augmentin. Use prednisone for allergic rhinitis flare. For sore throat or cough try using a honey-based tea. Use 3 teaspoons of honey with juice squeezed from half lemon. Place shaved pieces of ginger into 1/2-1 cup of water and warm over stove top. Then mix the ingredients and repeat every 4 hours as needed. Please take Tylenol 500mg -650mg  every 6 hours for throat pain, fevers, aches and pains. Hydrate very well with at least 2 liters of water. Eat light meals such as soups (chicken and noodles, vegetable, chicken and wild rice).  Do not eat foods that you are allergic to.  Taking an antihistamine like Zyrtec can help against postnasal drainage, sinus congestion which can cause sinus pain, sinus headaches, throat pain, painful swallowing, coughing.  After you finish prednisone, you can start taking pseudoephedrine (Sudafed) at a dose of 60 mg 3 times a day or twice daily as needed for the same kind of nasal drip, congestion.  Use cough medication as needed. Hold Flonase for the next 2 weeks to make sure you clear your sinus infection.

## 2023-02-21 NOTE — ED Provider Notes (Signed)
[facility]  Note:  This document was prepared using Conservation officer, historic buildings and may include unintentional dictation errors.  MRN: [id number] DOB: [birth year]  Subjective:   Laurie Stout is a 32 y.o. female presenting for 4 week history of persistent sinus pressure, congestion, sinus drainage, throat pain from the drainage, difficulty hearing due to ear fullness. Has an intermittent non-productive cough. No chest pain, shob, wheezing. No smoking of any kind including cigarettes, cigars, vaping, marijuana use.  No asthma.   No current facility-administered medications for this encounter.  Current Outpatient Medications:    cetirizine (ZYRTEC) 10 MG tablet, Take 10 mg by mouth daily., Disp: , Rfl:    cyclobenzaprine (FLEXERIL) 10 MG tablet, Take 1 tablet (10 mg total) by mouth at bedtime., Disp: 20 tablet, Rfl: 0   fluticasone (FLONASE) 50 MCG/ACT nasal spray, Place into the nose., Disp: , Rfl:    medroxyPROGESTERone (PROVERA) 10 MG tablet, Take 1 tablet (10 mg total) by mouth daily. Use for ten days, Disp: 10 tablet, Rfl: 0   norgestimate-ethinyl estradiol (ORTHO-CYCLEN) 0.25-35 MG-MCG tablet, Take 1 tablet by mouth daily., Disp: 1 Package, Rfl: 11   No Known Allergies  Past Medical History:  Diagnosis Date   Broken finger 2009   right pinkey - ortman     History reviewed. No pertinent surgical history.  Family History  Problem Relation Age of Onset   Diabetes Other     Social History   Tobacco Use   Smoking status: Never   Smokeless tobacco: Never  Vaping Use   Vaping Use: Never used  Substance Use Topics   Alcohol use: Not Currently   Drug use: Never    ROS   Objective:   Vitals: BP 120/84 (BP Location: Right Arm)   Pulse 74   Temp 98 F (36.7 C) (Oral)   Resp 18   LMP 01/28/2023 (Exact Date)   SpO2 98%   Physical Exam Constitutional:      General: She is not in acute distress.    Appearance: Normal appearance. She  is well-developed and normal weight. She is not ill-appearing, toxic-appearing or diaphoretic.  HENT:     Head: Normocephalic and atraumatic.     Right Ear: Ear canal and external ear normal. No drainage or tenderness. No middle ear effusion. There is no impacted cerumen. Tympanic membrane is not erythematous or bulging.     Left Ear: Ear canal and external ear normal. No drainage or tenderness.  No middle ear effusion. There is no impacted cerumen. Tympanic membrane is not erythematous or bulging.     Ears:     Comments: Bilateral air-fluid level.     Nose: Congestion and rhinorrhea present.     Mouth/Throat:     Mouth: Mucous membranes are moist. No oral lesions.     Pharynx: No pharyngeal swelling, oropharyngeal exudate, posterior oropharyngeal erythema or uvula swelling.     Tonsils: No tonsillar exudate or tonsillar abscesses.     Comments: Thick streaks of post-nasal drainage overlying pharynx.  Eyes:     General: No scleral icterus.       Right eye: No discharge.        Left eye: No discharge.     Extraocular Movements: Extraocular movements intact.     Right eye: Normal extraocular motion.     Left eye: Normal extraocular motion.     Conjunctiva/sclera: Conjunctivae normal.  Cardiovascular:     Rate and Rhythm: Normal rate.  Pulmonary:     Effort: Pulmonary effort is normal.  Musculoskeletal:     Cervical back: Normal range of motion and neck supple.  Lymphadenopathy:     Cervical: No cervical adenopathy.  Skin:    General: Skin is warm and dry.  Neurological:     General: No focal deficit present.     Mental Status: She is alert and oriented to person, place, and time.  Psychiatric:        Mood and Affect: Mood normal.        Behavior: Behavior normal.     Assessment and Plan :   PDMP not reviewed this encounter.  1. Other acute sinusitis, recurrence not specified   2. Allergic rhinitis, unspecified seasonality, unspecified trigger     Will start empiric  treatment for sinusitis with Augmentin. Hold Flonase. Will supplement treatment with an oral prednisone course to address her allergic rhinitis flare.  Recommended supportive care otherwise. Counseled patient on potential for adverse effects with medications prescribed/recommended today, ER and return-to-clinic precautions discussed, patient verbalized understanding.    Wallis Bamberg, [place] 02/21/23 [phone]

## 2023-08-20 ENCOUNTER — Encounter: Payer: Self-pay | Admitting: Family Medicine

## 2023-08-20 ENCOUNTER — Ambulatory Visit: Payer: Commercial Managed Care - PPO | Admitting: Family Medicine

## 2023-08-20 VITALS — BP 132/81 | HR 78 | Temp 99.2°F | Resp 16 | Ht 67.0 in | Wt 196.0 lb

## 2023-08-20 DIAGNOSIS — Z13228 Encounter for screening for other metabolic disorders: Secondary | ICD-10-CM | POA: Diagnosis not present

## 2023-08-20 DIAGNOSIS — Z130 Encounter for screening for diseases of the blood and blood-forming organs and certain disorders involving the immune mechanism: Secondary | ICD-10-CM

## 2023-08-20 DIAGNOSIS — Z7689 Persons encountering health services in other specified circumstances: Secondary | ICD-10-CM

## 2023-08-20 DIAGNOSIS — Z1329 Encounter for screening for other suspected endocrine disorder: Secondary | ICD-10-CM | POA: Diagnosis not present

## 2023-08-20 DIAGNOSIS — Z0000 Encounter for general adult medical examination without abnormal findings: Secondary | ICD-10-CM

## 2023-08-20 DIAGNOSIS — Z13220 Encounter for screening for lipoid disorders: Secondary | ICD-10-CM | POA: Diagnosis not present

## 2023-08-20 NOTE — Progress Notes (Signed)
New Patient Office Visit  Subjective    Patient ID: Laurie Stout, female    DOB: 04-Aug-1991  Age: 32 y.o. MRN: [id number]  CC:  Chief Complaint  Patient presents with   Annual Exam    HPI Laurie Stout presents to establish care and for a routine annual exam. Patient denies acute complaints or concerns.    Outpatient Encounter Medications as of 08/20/2023  Medication Sig   cetirizine (ZYRTEC) 10 MG tablet Take 10 mg by mouth daily.   amoxicillin-clavulanate (AUGMENTIN) 875-125 MG tablet Take 1 tablet by mouth 2 (two) times daily. (Patient not taking: Reported on 08/20/2023)   cyclobenzaprine (FLEXERIL) 10 MG tablet Take 1 tablet (10 mg total) by mouth at bedtime. (Patient not taking: Reported on 08/20/2023)   fluticasone (FLONASE) 50 MCG/ACT nasal spray Place into the nose.   medroxyPROGESTERone (PROVERA) 10 MG tablet Take 1 tablet (10 mg total) by mouth daily. Use for ten days (Patient not taking: Reported on 08/20/2023)   norgestimate-ethinyl estradiol (ORTHO-CYCLEN) 0.25-35 MG-MCG tablet Take 1 tablet by mouth daily. (Patient not taking: Reported on 08/20/2023)   predniSONE (DELTASONE) 20 MG tablet Take 2 tablets daily with breakfast. (Patient not taking: Reported on 08/20/2023)   No facility-administered encounter medications on file as of 08/20/2023.    Past Medical History:  Diagnosis Date   Broken finger 2009   right pinkey - ortman    History reviewed. No pertinent surgical history.  Family History  Problem Relation Age of Onset   Diabetes Other     Social History   Socioeconomic History   Marital status: Single    Spouse name: Not on file   Number of children: Not on file   Years of education: Not on file   Highest education level: Not on file  Occupational History   Not on file  Tobacco Use   Smoking status: Never   Smokeless tobacco: Never  Vaping Use   Vaping status: Never Used  Substance and Sexual Activity   Alcohol use: Not Currently    Drug use: Never   Sexual activity: Yes    Partners: Female    Birth control/protection: None  Other Topics Concern   Not on file  Social History Narrative   hh of 6   Entering 12 th grade   [employer]   Sleep 8 hours or more   Pets 1 dog and fish         Social Determinants of Health   Financial Resource Strain: Low Risk  (08/20/2023)   Overall Financial Resource Strain (CARDIA)    Difficulty of Paying Living Expenses: Not hard at all  Food Insecurity: No Food Insecurity (08/20/2023)   Hunger Vital Sign    Worried About Running Out of Food in the Last Year: Never true    Ran Out of Food in the Last Year: Never true  Transportation Needs: No Transportation Needs (08/20/2023)   PRAPARE - Administrator, Civil Service (Medical): No    Lack of Transportation (Non-Medical): No  Physical Activity: Insufficiently Active (08/20/2023)   Exercise Vital Sign    Days of Exercise per Week: 2 days    Minutes of Exercise per Session: 30 min  Stress: Stress Concern Present (08/20/2023)   [employer] of Occupational Health - Occupational Stress Questionnaire    Feeling of Stress : To some extent  Social Connections: Socially Isolated (08/20/2023)   Social Connection and Isolation Panel [NHANES]    Frequency of  Communication with Friends and Family: Never    Frequency of Social Gatherings with Friends and Family: Three times a week    Attends Religious Services: Never    Active Member of Clubs or Organizations: No    Attends Banker Meetings: Never    Marital Status: Never married  Intimate Partner Violence: Not At Risk (08/20/2023)   Humiliation, Afraid, Rape, and Kick questionnaire    Fear of Current or Ex-Partner: No    Emotionally Abused: No    Physically Abused: No    Sexually Abused: No    Review of Systems  All other systems reviewed and are negative.       Objective    BP 132/81 (BP Location: Left Arm, Patient Position:  Sitting, Cuff Size: Normal)   Pulse 78   Temp 99.2 F (37.3 C) (Oral)   Resp 16   Ht 5\' 7"  (1.702 m)   Wt 196 lb (88.9 kg)   SpO2 97%   BMI 30.70 kg/m   Physical Exam Vitals and nursing note reviewed.  Constitutional:      General: She is not in acute distress. HENT:     Head: Normocephalic and atraumatic.     Right Ear: Tympanic membrane, ear canal and external ear normal.     Left Ear: Tympanic membrane, ear canal and external ear normal.     Nose: Nose normal.     Mouth/Throat:     Mouth: Mucous membranes are moist.     Pharynx: Oropharynx is clear.  Eyes:     Conjunctiva/sclera: Conjunctivae normal.     Pupils: Pupils are equal, round, and reactive to light.  Neck:     Thyroid: No thyromegaly.  Cardiovascular:     Rate and Rhythm: Normal rate and regular rhythm.     Heart sounds: Normal heart sounds. No murmur heard. Pulmonary:     Effort: Pulmonary effort is normal. No respiratory distress.     Breath sounds: Normal breath sounds.  Abdominal:     General: There is no distension.     Palpations: Abdomen is soft. There is no mass.     Tenderness: There is no abdominal tenderness.  Musculoskeletal:        General: Normal range of motion.     Cervical back: Normal range of motion and neck supple.  Skin:    General: Skin is warm and dry.  Neurological:     General: No focal deficit present.     Mental Status: She is alert and oriented to person, place, and time.  Psychiatric:        Mood and Affect: Mood normal.        Behavior: Behavior normal.         Assessment & Plan:   Annual physical exam -     CMP14+EGFR  Screening for deficiency anemia -     CBC with Differential/Platelet  Screening for lipid disorders -     Lipid panel  Screening for endocrine/metabolic/immunity disorders -     Hemoglobin A1c  Encounter to establish care     No follow-ups on file.   Tommie Raymond, MD

## 2023-08-21 LAB — CBC WITH DIFFERENTIAL/PLATELET
Basophils Absolute: 0.1 10*3/uL (ref 0.0–0.2)
Basos: 1 %
EOS (ABSOLUTE): 0.2 10*3/uL (ref 0.0–0.4)
Eos: 2 %
Hematocrit: 44.3 % (ref 34.0–46.6)
Hemoglobin: 14.6 g/dL (ref 11.1–15.9)
Immature Grans (Abs): 0.1 10*3/uL (ref 0.0–0.1)
Immature Granulocytes: 1 %
Lymphocytes Absolute: 4.1 10*3/uL — ABNORMAL HIGH (ref 0.7–3.1)
Lymphs: 41 %
MCH: 28 pg (ref 26.6–33.0)
MCHC: 33 g/dL (ref 31.5–35.7)
MCV: 85 fL (ref 79–97)
Monocytes Absolute: 0.7 10*3/uL (ref 0.1–0.9)
Monocytes: 7 %
Neutrophils Absolute: 4.9 10*3/uL (ref 1.4–7.0)
Neutrophils: 48 %
Platelets: 227 10*3/uL (ref 150–450)
RBC: 5.21 x10E6/uL (ref 3.77–5.28)
RDW: 13 % (ref 11.7–15.4)
WBC: 10 10*3/uL (ref 3.4–10.8)

## 2023-08-21 LAB — CMP14+EGFR
ALT: 18 [IU]/L (ref 0–32)
AST: 17 [IU]/L (ref 0–40)
Albumin: 4.4 g/dL (ref 4.0–5.0)
Alkaline Phosphatase: 51 [IU]/L (ref 44–121)
BUN/Creatinine Ratio: 18 (ref 9–23)
BUN: 14 mg/dL (ref 6–20)
Bilirubin Total: <0.2 mg/dL (ref 0.0–1.2)
CO2: 22 mmol/L (ref 20–29)
Calcium: 9.4 mg/dL (ref 8.7–10.2)
Chloride: 104 mmol/L (ref 96–106)
Creatinine, Ser: 0.78 mg/dL (ref 0.57–1.00)
Globulin, Total: 2.2 g/dL (ref 1.5–4.5)
Glucose: 110 mg/dL — ABNORMAL HIGH (ref 70–99)
Potassium: 4.4 mmol/L (ref 3.5–5.2)
Sodium: 140 mmol/L (ref 134–144)
Total Protein: 6.6 g/dL (ref 6.0–8.5)
eGFR: 105 mL/min/{1.73_m2} (ref >59)

## 2023-08-21 LAB — LIPID PANEL
Chol/HDL Ratio: 5.5 ratio — ABNORMAL HIGH (ref 0.0–4.4)
Cholesterol, Total: 210 mg/dL — ABNORMAL HIGH (ref 100–199)
HDL: 38 mg/dL — ABNORMAL LOW (ref >39)
LDL Chol Calc (NIH): 134 mg/dL — ABNORMAL HIGH (ref 0–99)
Triglycerides: 210 mg/dL — ABNORMAL HIGH (ref 0–149)
VLDL Cholesterol Cal: 38 mg/dL (ref 5–40)

## 2023-08-21 LAB — HEMOGLOBIN A1C
Est. average glucose Bld gHb Est-mCnc: 108 mg/dL
Hgb A1c MFr Bld: 5.4 % (ref 4.8–5.6)

## 2023-11-17 ENCOUNTER — Ambulatory Visit: Payer: Commercial Managed Care - PPO | Admitting: Family Medicine

## 2023-11-17 VITALS — BP 114/76 | HR 76 | Temp 97.9°F | Resp 16 | Wt 195.0 lb

## 2023-11-17 DIAGNOSIS — F419 Anxiety disorder, unspecified: Secondary | ICD-10-CM

## 2023-11-17 DIAGNOSIS — F32A Depression, unspecified: Secondary | ICD-10-CM

## 2023-11-17 DIAGNOSIS — G4700 Insomnia, unspecified: Secondary | ICD-10-CM | POA: Diagnosis not present

## 2023-11-17 DIAGNOSIS — R41840 Attention and concentration deficit: Secondary | ICD-10-CM | POA: Diagnosis not present

## 2023-11-17 MED ORDER — SERTRALINE HCL 50 MG PO TABS
50.0000 mg | ORAL_TABLET | Freq: Every day | ORAL | 3 refills | Status: DC
Start: 2023-11-17 — End: 2023-12-14

## 2023-11-17 MED ORDER — TRAZODONE HCL 50 MG PO TABS
50.0000 mg | ORAL_TABLET | Freq: Every day | ORAL | 0 refills | Status: DC
Start: 2023-11-17 — End: 2023-12-14

## 2023-11-18 ENCOUNTER — Encounter: Payer: Self-pay | Admitting: Family Medicine

## 2023-11-18 NOTE — Progress Notes (Signed)
Established Patient Office Visit  Subjective    Patient ID: Laurie Stout, female    DOB: July 08, 1991  Age: 33 y.o. MRN: [id number]  CC:  Chief Complaint  Patient presents with   Medical Management of Chronic Issues    HPI Laurie Stout presents with complaint of anxiety and insomnia. Patient reports increased social stressors.   Outpatient Encounter Medications as of 11/17/2023  Medication Sig   sertraline (ZOLOFT) 50 MG tablet Take 1 tablet (50 mg total) by mouth daily.   traZODone (DESYREL) 50 MG tablet Take 1 tablet (50 mg total) by mouth at bedtime.   amoxicillin-clavulanate (AUGMENTIN) 875-125 MG tablet Take 1 tablet by mouth 2 (two) times daily. (Patient not taking: Reported on 11/17/2023)   cetirizine (ZYRTEC) 10 MG tablet Take 10 mg by mouth daily. (Patient not taking: Reported on 11/17/2023)   cyclobenzaprine (FLEXERIL) 10 MG tablet Take 1 tablet (10 mg total) by mouth at bedtime. (Patient not taking: Reported on 11/17/2023)   fluticasone (FLONASE) 50 MCG/ACT nasal spray Place into the nose.   medroxyPROGESTERone (PROVERA) 10 MG tablet Take 1 tablet (10 mg total) by mouth daily. Use for ten days (Patient not taking: Reported on 11/17/2023)   norgestimate-ethinyl estradiol (ORTHO-CYCLEN) 0.25-35 MG-MCG tablet Take 1 tablet by mouth daily. (Patient not taking: Reported on 11/17/2023)   predniSONE (DELTASONE) 20 MG tablet Take 2 tablets daily with breakfast. (Patient not taking: Reported on 11/17/2023)   No facility-administered encounter medications on file as of 11/17/2023.    Past Medical History:  Diagnosis Date   Broken finger 2009   right pinkey - ortman    No past surgical history on file.  Family History  Problem Relation Age of Onset   Diabetes Other     Social History   Socioeconomic History   Marital status: Single    Spouse name: Not on file   Number of children: Not on file   Years of education: Not on file   Highest education level: Bachelor's  degree (e.g., BA, AB, BS)  Occupational History   Not on file  Tobacco Use   Smoking status: Never   Smokeless tobacco: Never  Vaping Use   Vaping status: Never Used  Substance and Sexual Activity   Alcohol use: Not Currently   Drug use: Never   Sexual activity: Yes    Partners: Female    Birth control/protection: None  Other Topics Concern   Not on file  Social History Narrative   hh of 6   Entering 12 th grade   [employer]   Sleep 8 hours or more   Pets 1 dog and fish         Social Drivers of Corporate investment banker Strain: Low Risk  (11/17/2023)   Overall Financial Resource Strain (CARDIA)    Difficulty of Paying Living Expenses: Not very hard  Food Insecurity: No Food Insecurity (11/17/2023)   Hunger Vital Sign    Worried About Running Out of Food in the Last Year: Never true    Ran Out of Food in the Last Year: Never true  Transportation Needs: No Transportation Needs (11/17/2023)   PRAPARE - Administrator, Civil Service (Medical): No    Lack of Transportation (Non-Medical): No  Physical Activity: Sufficiently Active (11/17/2023)   Exercise Vital Sign    Days of Exercise per Week: 3 days    Minutes of Exercise per Session: 60 min  Recent Concern: Physical Activity - Insufficiently  Active (08/20/2023)   Exercise Vital Sign    Days of Exercise per Week: 2 days    Minutes of Exercise per Session: 30 min  Stress: Stress Concern Present (11/17/2023)   [employer] of Occupational Health - Occupational Stress Questionnaire    Feeling of Stress : Very much  Social Connections: Socially Isolated (11/17/2023)   Social Connection and Isolation Panel [NHANES]    Frequency of Communication with Friends and Family: Three times a week    Frequency of Social Gatherings with Friends and Family: Three times a week    Attends Religious Services: Never    Active Member of Clubs or Organizations: No    Attends Banker Meetings: Never     Marital Status: Never married  Intimate Partner Violence: Not At Risk (08/20/2023)   Humiliation, Afraid, Rape, and Kick questionnaire    Fear of Current or Ex-Partner: No    Emotionally Abused: No    Physically Abused: No    Sexually Abused: No    Review of Systems  Psychiatric/Behavioral:  Positive for depression. Negative for suicidal ideas. The patient is nervous/anxious and has insomnia.   All other systems reviewed and are negative.       Objective    BP 114/76   Pulse 76   Temp 97.9 F (36.6 C) (Oral)   Resp 16   Wt 195 lb (88.5 kg)   SpO2 99%   BMI 30.54 kg/m   Physical Exam Vitals and nursing note reviewed.  Constitutional:      General: She is not in acute distress. Cardiovascular:     Rate and Rhythm: Normal rate and regular rhythm.  Pulmonary:     Effort: Pulmonary effort is normal.     Breath sounds: Normal breath sounds.  Neurological:     General: No focal deficit present.     Mental Status: She is alert and oriented to person, place, and time.  Psychiatric:        Mood and Affect: Affect normal. Mood is anxious and depressed.         Assessment & Plan:   Anxiety and depression  Insomnia, unspecified type  Other orders -     traZODone HCl; Take 1 tablet (50 mg total) by mouth at bedtime.  Dispense: 30 tablet; Refill: 0 -     Sertraline HCl; Take 1 tablet (50 mg total) by mouth daily.  Dispense: 30 tablet; Refill: 3     Return in about 4 weeks (around 12/15/2023) for follow up, chronic med issues.   Tommie Raymond, MD

## 2023-11-19 DIAGNOSIS — Z79899 Other long term (current) drug therapy: Secondary | ICD-10-CM | POA: Diagnosis not present

## 2023-11-19 DIAGNOSIS — R41840 Attention and concentration deficit: Secondary | ICD-10-CM | POA: Diagnosis not present

## 2023-12-09 DIAGNOSIS — F419 Anxiety disorder, unspecified: Secondary | ICD-10-CM | POA: Diagnosis not present

## 2023-12-09 DIAGNOSIS — R41840 Attention and concentration deficit: Secondary | ICD-10-CM | POA: Diagnosis not present

## 2023-12-14 ENCOUNTER — Encounter: Payer: Self-pay | Admitting: Family Medicine

## 2023-12-14 ENCOUNTER — Other Ambulatory Visit: Payer: Self-pay | Admitting: Family Medicine

## 2023-12-14 ENCOUNTER — Ambulatory Visit: Payer: Commercial Managed Care - PPO | Admitting: Family Medicine

## 2023-12-14 VITALS — BP 122/86 | HR 77 | Temp 97.6°F | Resp 16 | Ht 66.0 in | Wt 189.4 lb

## 2023-12-14 DIAGNOSIS — F419 Anxiety disorder, unspecified: Secondary | ICD-10-CM | POA: Diagnosis not present

## 2023-12-14 DIAGNOSIS — G4700 Insomnia, unspecified: Secondary | ICD-10-CM

## 2023-12-14 DIAGNOSIS — F32A Depression, unspecified: Secondary | ICD-10-CM

## 2023-12-14 MED ORDER — SERTRALINE HCL 50 MG PO TABS
50.0000 mg | ORAL_TABLET | Freq: Every day | ORAL | 1 refills | Status: DC
  Filled 2024-03-22: qty 90, 90d supply, fill #0

## 2023-12-14 MED ORDER — TRAZODONE HCL 50 MG PO TABS: 50.0000 mg | ORAL_TABLET | Freq: Every day | ORAL | 1 refills | Status: AC

## 2023-12-15 ENCOUNTER — Encounter: Payer: Self-pay | Admitting: Family Medicine

## 2023-12-15 NOTE — Progress Notes (Signed)
Established Patient Office Visit  Subjective    Patient ID: Laurie Stout, female    DOB: Jan 04, 1991  Age: 33 y.o. MRN: [id number]  CC:  Chief Complaint  Patient presents with   Follow-up    4 week    HPI Laurie Stout presents for routine follow up of anxiety/depression and insomnia. Patient reports med compliance and denies acute complaints.   Outpatient Encounter Medications as of 12/14/2023  Medication Sig   [DISCONTINUED] sertraline (ZOLOFT) 50 MG tablet Take 1 tablet (50 mg total) by mouth daily.   amoxicillin-clavulanate (AUGMENTIN) 875-125 MG tablet Take 1 tablet by mouth 2 (two) times daily. (Patient not taking: Reported on 11/17/2023)   cetirizine (ZYRTEC) 10 MG tablet Take 10 mg by mouth daily. (Patient not taking: Reported on 11/17/2023)   cyclobenzaprine (FLEXERIL) 10 MG tablet Take 1 tablet (10 mg total) by mouth at bedtime. (Patient not taking: Reported on 11/17/2023)   fluticasone (FLONASE) 50 MCG/ACT nasal spray Place into the nose.   medroxyPROGESTERone (PROVERA) 10 MG tablet Take 1 tablet (10 mg total) by mouth daily. Use for ten days (Patient not taking: Reported on 11/17/2023)   norgestimate-ethinyl estradiol (ORTHO-CYCLEN) 0.25-35 MG-MCG tablet Take 1 tablet by mouth daily. (Patient not taking: Reported on 11/17/2023)   predniSONE (DELTASONE) 20 MG tablet Take 2 tablets daily with breakfast. (Patient not taking: Reported on 11/17/2023)   sertraline (ZOLOFT) 50 MG tablet Take 1 tablet (50 mg total) by mouth daily.   traZODone (DESYREL) 50 MG tablet Take 1 tablet (50 mg total) by mouth at bedtime.   [DISCONTINUED] traZODone (DESYREL) 50 MG tablet Take 1 tablet (50 mg total) by mouth at bedtime.   No facility-administered encounter medications on file as of 12/14/2023.    Past Medical History:  Diagnosis Date   Broken finger 2009   right pinkey - ortman    History reviewed. No pertinent surgical history.  Family History  Problem Relation Age of Onset    Diabetes Other     Social History   Socioeconomic History   Marital status: Single    Spouse name: Not on file   Number of children: Not on file   Years of education: Not on file   Highest education level: Bachelor's degree (e.g., BA, AB, BS)  Occupational History   Not on file  Tobacco Use   Smoking status: Never   Smokeless tobacco: Never  Vaping Use   Vaping status: Never Used  Substance and Sexual Activity   Alcohol use: Not Currently   Drug use: Never   Sexual activity: Yes    Partners: Female    Birth control/protection: None  Other Topics Concern   Not on file  Social History Narrative   hh of 6   Entering 12 th grade   [employer]   Sleep 8 hours or more   Pets 1 dog and fish         Social Drivers of Corporate investment banker Strain: Low Risk  (11/17/2023)   Overall Financial Resource Strain (CARDIA)    Difficulty of Paying Living Expenses: Not very hard  Food Insecurity: No Food Insecurity (11/17/2023)   Hunger Vital Sign    Worried About Running Out of Food in the Last Year: Never true    Ran Out of Food in the Last Year: Never true  Transportation Needs: No Transportation Needs (11/17/2023)   PRAPARE - Transportation    Lack of Transportation (Medical): No    Lack of  Transportation (Non-Medical): No  Physical Activity: Sufficiently Active (11/17/2023)   Exercise Vital Sign    Days of Exercise per Week: 3 days    Minutes of Exercise per Session: 60 min  Recent Concern: Physical Activity - Insufficiently Active (08/20/2023)   Exercise Vital Sign    Days of Exercise per Week: 2 days    Minutes of Exercise per Session: 30 min  Stress: Stress Concern Present (11/17/2023)   [employer] of Occupational Health - Occupational Stress Questionnaire    Feeling of Stress : Very much  Social Connections: Socially Isolated (11/17/2023)   Social Connection and Isolation Panel [NHANES]    Frequency of Communication with Friends and Family: Three times a  week    Frequency of Social Gatherings with Friends and Family: Three times a week    Attends Religious Services: Never    Active Member of Clubs or Organizations: No    Attends Banker Meetings: Never    Marital Status: Never married  Intimate Partner Violence: Not At Risk (08/20/2023)   Humiliation, Afraid, Rape, and Kick questionnaire    Fear of Current or Ex-Partner: No    Emotionally Abused: No    Physically Abused: No    Sexually Abused: No    Review of Systems  All other systems reviewed and are negative.       Objective    BP 122/86 (BP Location: Right Arm, Patient Position: Sitting, Cuff Size: Normal)   Pulse 77   Temp 97.6 F (36.4 C) (Oral)   Resp 16   Ht 5\' 6"  (1.676 m)   Wt 189 lb 6.4 oz (85.9 kg)   SpO2 96%   BMI 30.57 kg/m   Physical Exam Vitals and nursing note reviewed.  Constitutional:      General: She is not in acute distress. Cardiovascular:     Rate and Rhythm: Normal rate and regular rhythm.  Pulmonary:     Effort: Pulmonary effort is normal.     Breath sounds: Normal breath sounds.  Neurological:     General: No focal deficit present.     Mental Status: She is alert and oriented to person, place, and time.  Psychiatric:        Mood and Affect: Affect normal. Mood is anxious and depressed.         Assessment & Plan:   Anxiety and depression  Insomnia, unspecified type  Other orders -     Sertraline HCl; Take 1 tablet (50 mg total) by mouth daily.  Dispense: 90 tablet; Refill: 1 -     traZODone HCl; Take 1 tablet (50 mg total) by mouth at bedtime.  Dispense: 90 tablet; Refill: 1     Return in about 6 months (around 06/12/2024) for chronic med issues, follow up.   Tommie Raymond, MD

## 2023-12-17 ENCOUNTER — Other Ambulatory Visit: Payer: Self-pay | Admitting: Family Medicine

## 2023-12-17 NOTE — Telephone Encounter (Signed)
Copied from CRM [id number]. Topic: Clinical - Medication Refill >> Dec 17, 2023  7:45 AM Nada Libman H wrote: Most Recent Primary Care Visit:  Provider: Georganna Skeans  Department: [facility]  Visit Type: OFFICE VISIT  Date: 12/14/2023  Medication: sertraline (ZOLOFT) 50 MG tablet [[id number]]  Has the patient contacted their pharmacy? Yes (Agent: If no, request that the patient contact the pharmacy for the refill. If patient does not wish to contact the pharmacy document the reason why and proceed with request.) (Agent: If yes, when and what did the pharmacy advise?) Adivsed of contact office  Is this the correct pharmacy for this prescription? Yes If no, delete pharmacy and type the correct one.  This is the patient's preferred pharmacy:  [id number] [place], [place] - [phone] AT [facility] & [address] [place] [address] [place] [address] Phone: [phone] Fax: [phone]     Has the prescription been filled recently? No  Is the patient out of the medication? Yes  Has the patient been seen for an appointment in the last year OR does the patient have an upcoming appointment? Yes  Can we respond through MyChart? Yes  Agent: Please be advised that Rx refills may take up to 3 business days. We ask that you follow-up with your pharmacy.

## 2023-12-17 NOTE — Telephone Encounter (Signed)
See note Requested Prescriptions  Pending Prescriptions Disp Refills   sertraline (ZOLOFT) 50 MG tablet 90 tablet 1    Sig: Take 1 tablet (50 mg total) by mouth daily.     Psychiatry:  Antidepressants - SSRI - sertraline Passed - 12/17/2023  2:39 PM      Passed - AST in normal range and within 360 days    AST  Date Value Ref Range Status  08/20/2023 17 0 - 40 IU/L Final         Passed - ALT in normal range and within 360 days    ALT  Date Value Ref Range Status  08/20/2023 18 0 - 32 IU/L Final         Passed - Completed PHQ-2 or PHQ-9 in the last 360 days      Passed - Valid encounter within last 6 months    Recent Outpatient Visits           3 days ago Anxiety and depression   [place] [facility], [place]   1 month ago Anxiety and depression    [facility] at [facility], MD   3 months ago Annual physical exam   [facility], [place]

## 2023-12-17 NOTE — Telephone Encounter (Signed)
Call to pharmacy-they do have Rx on file- it has note on it - Must be filled ay [facility]. Attempted to call patient to advised - no answer and no VM set up.  Requested Prescriptions  Pending Prescriptions Disp Refills   sertraline (ZOLOFT) 50 MG tablet 90 tablet 1    Sig: Take 1 tablet (50 mg total) by mouth daily.     Psychiatry:  Antidepressants - SSRI - sertraline Passed - 12/17/2023  2:39 PM      Passed - AST in normal range and within 360 days    AST  Date Value Ref Range Status  08/20/2023 17 0 - 40 IU/L Final         Passed - ALT in normal range and within 360 days    ALT  Date Value Ref Range Status  08/20/2023 18 0 - 32 IU/L Final         Passed - Completed PHQ-2 or PHQ-9 in the last 360 days      Passed - Valid encounter within last 6 months    Recent Outpatient Visits           3 days ago Anxiety and depression   [place] [facility], MD   1 month ago Anxiety and depression   [place] [facility], MD   3 months ago Annual physical exam   [facility], [place]

## 2024-01-11 DIAGNOSIS — F411 Generalized anxiety disorder: Secondary | ICD-10-CM | POA: Diagnosis not present

## 2024-01-26 DIAGNOSIS — F411 Generalized anxiety disorder: Secondary | ICD-10-CM | POA: Diagnosis not present

## 2024-02-16 DIAGNOSIS — F411 Generalized anxiety disorder: Secondary | ICD-10-CM | POA: Diagnosis not present

## 2024-03-01 DIAGNOSIS — F411 Generalized anxiety disorder: Secondary | ICD-10-CM | POA: Diagnosis not present

## 2024-03-09 ENCOUNTER — Other Ambulatory Visit (HOSPITAL_BASED_OUTPATIENT_CLINIC_OR_DEPARTMENT_OTHER): Payer: Self-pay

## 2024-03-09 DIAGNOSIS — F419 Anxiety disorder, unspecified: Secondary | ICD-10-CM | POA: Diagnosis not present

## 2024-03-09 DIAGNOSIS — Z79899 Other long term (current) drug therapy: Secondary | ICD-10-CM | POA: Diagnosis not present

## 2024-03-09 DIAGNOSIS — F902 Attention-deficit hyperactivity disorder, combined type: Secondary | ICD-10-CM | POA: Diagnosis not present

## 2024-03-09 MED ORDER — LISDEXAMFETAMINE DIMESYLATE 10 MG PO CAPS
10.0000 mg | ORAL_CAPSULE | Freq: Every day | ORAL | 0 refills | Status: DC
  Filled 2024-03-09: qty 30, 30d supply, fill #0

## 2024-03-15 DIAGNOSIS — F411 Generalized anxiety disorder: Secondary | ICD-10-CM | POA: Diagnosis not present

## 2024-03-20 ENCOUNTER — Other Ambulatory Visit: Payer: Self-pay | Admitting: Family Medicine

## 2024-03-22 ENCOUNTER — Other Ambulatory Visit (HOSPITAL_BASED_OUTPATIENT_CLINIC_OR_DEPARTMENT_OTHER): Payer: Self-pay

## 2024-03-23 ENCOUNTER — Ambulatory Visit
Admission: EM | Admit: 2024-03-23 | Discharge: 2024-03-23 | Disposition: A | Discharge status: Home / Self Care | Attending: Family Medicine | Admitting: Family Medicine

## 2024-03-23 ENCOUNTER — Ambulatory Visit (INDEPENDENT_AMBULATORY_CARE_PROVIDER_SITE_OTHER)

## 2024-03-23 ENCOUNTER — Other Ambulatory Visit (HOSPITAL_BASED_OUTPATIENT_CLINIC_OR_DEPARTMENT_OTHER): Payer: Self-pay

## 2024-03-23 DIAGNOSIS — J22 Unspecified acute lower respiratory infection: Secondary | ICD-10-CM | POA: Diagnosis not present

## 2024-03-23 DIAGNOSIS — K529 Noninfective gastroenteritis and colitis, unspecified: Secondary | ICD-10-CM | POA: Diagnosis not present

## 2024-03-23 DIAGNOSIS — R059 Cough, unspecified: Secondary | ICD-10-CM | POA: Diagnosis not present

## 2024-03-23 MED ORDER — ONDANSETRON HCL 4 MG/2ML IJ SOLN
4.0000 mg | Freq: Once | INTRAMUSCULAR | Status: AC
  Administered 2024-03-23: 4 mg via INTRAMUSCULAR

## 2024-03-23 MED ORDER — PROMETHAZINE-DM 6.25-15 MG/5ML PO SYRP
5.0000 mL | ORAL_SOLUTION | Freq: Three times a day (TID) | ORAL | 0 refills | Status: AC | PRN
  Filled 2024-03-23: qty 200, 14d supply, fill #0

## 2024-03-23 MED ORDER — PSEUDOEPHEDRINE HCL 60 MG PO TABS
60.0000 mg | ORAL_TABLET | Freq: Three times a day (TID) | ORAL | 0 refills | Status: AC | PRN
  Filled 2024-03-23: qty 30, 10d supply, fill #0

## 2024-03-23 MED ORDER — ONDANSETRON 8 MG PO TBDP
8.0000 mg | ORAL_TABLET | Freq: Three times a day (TID) | ORAL | 0 refills | Status: AC | PRN
  Filled 2024-03-23 – 2024-05-11 (×2): qty 20, 7d supply, fill #0

## 2024-03-23 MED ORDER — CETIRIZINE HCL 10 MG PO TABS
10.0000 mg | ORAL_TABLET | Freq: Every day | ORAL | 0 refills | Status: AC
  Filled 2024-03-23 – 2024-04-04 (×2): qty 30, 30d supply, fill #0

## 2024-03-23 NOTE — ED Triage Notes (Signed)
 Pt c/o n/v started yesterday-states she did not feel well last week "cough, lethargic, body aches, SHOB when I am walking around"-NAD-steady gait

## 2024-03-23 NOTE — Discharge Instructions (Signed)
 We will manage this as a viral illness. For sore throat or cough try using a honey-based tea. Use 3 teaspoons of honey with juice squeezed from half lemon. Place shaved pieces of ginger into 1/2-1 cup of water and warm over stove top. Then mix the ingredients and repeat every 4 hours as needed. Please take ibuprofen 600mg  every 6 hours with food alternating with OR taken together with Tylenol 500mg -650mg  every 6 hours for throat pain, fevers, aches and pains. Hydrate very well with at least 2 liters of water. Eat light meals such as soups (chicken and noodles, vegetable, chicken and wild rice).  Do not eat foods that you are allergic to.  Taking an antihistamine like Zyrtec (10mg  daily) can help against postnasal drainage, sinus congestion which can cause sinus pain, sinus headaches, throat pain, painful swallowing, coughing.  You can take this together with pseudoephedrine (Sudafed) at a dose of 60mg  3 times a day or twice daily as needed for the same kind of nasal drip, congestion.  Use cough syrup as needed. Zofran can help with the nausea and vomiting.

## 2024-03-23 NOTE — ED Provider Notes (Signed)
 [facility]  Note:  This document was prepared using Conservation officer, historic buildings and may include unintentional dictation errors.  MRN: [id number] DOB: May 07, 1991  Subjective:   Laurie Stout is a 33 y.o. female presenting for 10-day history of continued malaise, fatigue, coughing, shortness of breath, body pains, now having nausea with vomiting in the past day.  Has been a difficult time holding anything down including liquids since yesterday.  Initially her symptoms started out with upper respiratory sinus symptoms but have resolved.  Unfortunately her lower respiratory symptoms have persisted.  No history of asthma.  No smoking.  Has allergic rhinitis.  No chance pregnancy per patient.  Is currently on her cycle.  Defers UPT.  No current facility-administered medications for this encounter.  Current Outpatient Medications:    amoxicillin -clavulanate (AUGMENTIN ) 875-125 MG tablet, Take 1 tablet by mouth 2 (two) times daily. (Patient not taking: Reported on 11/17/2023), Disp: 14 tablet, Rfl: 0   cetirizine (ZYRTEC) 10 MG tablet, Take 10 mg by mouth daily. (Patient not taking: Reported on 11/17/2023), Disp: , Rfl:    cyclobenzaprine  (FLEXERIL ) 10 MG tablet, Take 1 tablet (10 mg total) by mouth at bedtime. (Patient not taking: Reported on 11/17/2023), Disp: 20 tablet, Rfl: 0   fluticasone (FLONASE) 50 MCG/ACT nasal spray, Place into the nose., Disp: , Rfl:    lisdexamfetamine (VYVANSE ) 10 MG capsule, Take 1 capsule by mouth daily, Disp: 30 capsule, Rfl: 0   medroxyPROGESTERone  (PROVERA ) 10 MG tablet, Take 1 tablet (10 mg total) by mouth daily. Use for ten days (Patient not taking: Reported on 11/17/2023), Disp: 10 tablet, Rfl: 0   norgestimate -ethinyl estradiol  (ORTHO-CYCLEN) 0.25-35 MG-MCG tablet, Take 1 tablet by mouth daily. (Patient not taking: Reported on 11/17/2023), Disp: 1 Package, Rfl: 11   predniSONE  (DELTASONE ) 20 MG tablet, Take 2 tablets daily with  breakfast. (Patient not taking: Reported on 11/17/2023), Disp: 10 tablet, Rfl: 0   sertraline  (ZOLOFT ) 50 MG tablet, Take 1 tablet (50 mg total) by mouth daily., Disp: 90 tablet, Rfl: 1   traZODone  (DESYREL ) 50 MG tablet, Take 1 tablet (50 mg total) by mouth at bedtime., Disp: 90 tablet, Rfl: 1   No Known Allergies  Past Medical History:  Diagnosis Date   Broken finger 2009   right pinkey - ortman     History reviewed. No pertinent surgical history.  Family History  Problem Relation Age of Onset   Diabetes Other     Social History   Tobacco Use   Smoking status: Never   Smokeless tobacco: Never  Vaping Use   Vaping status: Never Used  Substance Use Topics   Alcohol use: Not Currently   Drug use: Never    ROS   Objective:   Vitals: BP 126/77 (BP Location: Left Arm)   Pulse 74   Temp 98.3 F (36.8 C) (Oral)   Resp 19   LMP 03/21/2024   SpO2 98%   Physical Exam Constitutional:      General: She is not in acute distress.    Appearance: Normal appearance. She is well-developed. She is not ill-appearing, toxic-appearing or diaphoretic.  HENT:     Head: Normocephalic and atraumatic.     Nose: Nose normal.     Mouth/Throat:     Mouth: Mucous membranes are moist.     Pharynx: No oropharyngeal exudate or posterior oropharyngeal erythema.  Eyes:     General: No scleral icterus.       Right eye: No  discharge.        Left eye: No discharge.     Extraocular Movements: Extraocular movements intact.     Conjunctiva/sclera: Conjunctivae normal.  Cardiovascular:     Rate and Rhythm: Normal rate and regular rhythm.     Heart sounds: Normal heart sounds. No murmur heard.    No friction rub. No gallop.  Pulmonary:     Effort: Pulmonary effort is normal. No respiratory distress.     Breath sounds: No stridor. No wheezing, rhonchi or rales.     Comments: Decrease in lung sounds throughout. Chest:     Chest wall: No tenderness.  Abdominal:     General: Bowel sounds are  normal. There is no distension.     Palpations: Abdomen is soft. There is no mass.     Tenderness: There is no abdominal tenderness. There is no right CVA tenderness, left CVA tenderness, guarding or rebound.  Skin:    General: Skin is warm and dry.  Neurological:     General: No focal deficit present.     Mental Status: She is alert and oriented to person, place, and time.  Psychiatric:        Mood and Affect: Mood normal.        Behavior: Behavior normal.        Thought Content: Thought content normal.        Judgment: Judgment normal.     DG Chest 2 View Result Date: 03/23/2024 CLINICAL DATA:  Cough EXAM: CHEST - 2 VIEW COMPARISON:  None Available. FINDINGS: Frontal and lateral views of the chest demonstrate an unremarkable cardiac silhouette. No acute airspace disease, effusion, or pneumothorax. No acute bony abnormalities. IMPRESSION: 1. No acute intrathoracic process. Electronically Signed   By: Bobbye Burrow M.D.   On: 03/23/2024 09:16   IM Zofran 4 mg administered in clinic.  Assessment and Plan :   PDMP not reviewed this encounter.  1. Lower respiratory infection   2. Gastroenteritis    Suspect the patient is clearly in respiratory infection.  She declined steroids for now but would consider this if she continues to have lower respiratory symptoms.  Chest x-ray negative otherwise.  Recommend general supportive care, push fluids, light meals.  Counseled patient on potential for adverse effects with medications prescribed/recommended today, ER and return-to-clinic precautions discussed, patient verbalized understanding.    Adolph Hoop, [place] 03/23/24 [phone]

## 2024-03-28 DIAGNOSIS — F411 Generalized anxiety disorder: Secondary | ICD-10-CM | POA: Diagnosis not present

## 2024-04-02 ENCOUNTER — Other Ambulatory Visit (HOSPITAL_BASED_OUTPATIENT_CLINIC_OR_DEPARTMENT_OTHER): Payer: Self-pay

## 2024-04-04 ENCOUNTER — Other Ambulatory Visit (HOSPITAL_BASED_OUTPATIENT_CLINIC_OR_DEPARTMENT_OTHER): Payer: Self-pay

## 2024-04-11 DIAGNOSIS — F411 Generalized anxiety disorder: Secondary | ICD-10-CM | POA: Diagnosis not present

## 2024-04-14 ENCOUNTER — Other Ambulatory Visit (HOSPITAL_BASED_OUTPATIENT_CLINIC_OR_DEPARTMENT_OTHER): Payer: Self-pay

## 2024-05-05 ENCOUNTER — Other Ambulatory Visit (HOSPITAL_BASED_OUTPATIENT_CLINIC_OR_DEPARTMENT_OTHER): Payer: Self-pay

## 2024-05-05 MED ORDER — LISDEXAMFETAMINE DIMESYLATE 10 MG PO CAPS
10.0000 mg | ORAL_CAPSULE | Freq: Every day | ORAL | 0 refills | Status: DC
  Filled 2024-05-05: qty 30, 30d supply, fill #0

## 2024-05-11 ENCOUNTER — Other Ambulatory Visit (HOSPITAL_BASED_OUTPATIENT_CLINIC_OR_DEPARTMENT_OTHER): Payer: Self-pay

## 2024-05-19 DIAGNOSIS — F902 Attention-deficit hyperactivity disorder, combined type: Secondary | ICD-10-CM | POA: Diagnosis not present

## 2024-05-20 DIAGNOSIS — F419 Anxiety disorder, unspecified: Secondary | ICD-10-CM | POA: Diagnosis not present

## 2024-05-20 DIAGNOSIS — F902 Attention-deficit hyperactivity disorder, combined type: Secondary | ICD-10-CM | POA: Diagnosis not present

## 2024-05-20 DIAGNOSIS — Z79899 Other long term (current) drug therapy: Secondary | ICD-10-CM | POA: Diagnosis not present

## 2024-06-13 ENCOUNTER — Ambulatory Visit: Payer: Commercial Managed Care - PPO | Admitting: Family Medicine

## 2024-06-13 ENCOUNTER — Ambulatory Visit: Admitting: Family Medicine

## 2024-06-22 ENCOUNTER — Other Ambulatory Visit (HOSPITAL_BASED_OUTPATIENT_CLINIC_OR_DEPARTMENT_OTHER): Payer: Self-pay

## 2024-06-23 ENCOUNTER — Telehealth: Payer: Self-pay | Admitting: Family Medicine

## 2024-06-23 ENCOUNTER — Other Ambulatory Visit: Payer: Self-pay | Admitting: Family Medicine

## 2024-06-23 ENCOUNTER — Other Ambulatory Visit (HOSPITAL_BASED_OUTPATIENT_CLINIC_OR_DEPARTMENT_OTHER): Payer: Self-pay

## 2024-06-23 MED ORDER — SERTRALINE HCL 50 MG PO TABS
50.0000 mg | ORAL_TABLET | Freq: Every day | ORAL | 0 refills | Status: DC
  Filled 2024-06-23: qty 90, 90d supply, fill #0

## 2024-06-23 NOTE — Telephone Encounter (Signed)
  Pt requesting Zoloft  refill. Pt scheduled for follow up on 09/17. Please advise.

## 2024-07-13 ENCOUNTER — Encounter: Payer: Self-pay | Admitting: Family Medicine

## 2024-07-13 ENCOUNTER — Ambulatory Visit: Admitting: Family Medicine

## 2024-07-13 ENCOUNTER — Other Ambulatory Visit (HOSPITAL_BASED_OUTPATIENT_CLINIC_OR_DEPARTMENT_OTHER): Payer: Self-pay

## 2024-07-13 VITALS — BP 133/83 | HR 66 | Ht 66.0 in | Wt 199.0 lb

## 2024-07-13 DIAGNOSIS — F32A Depression, unspecified: Secondary | ICD-10-CM | POA: Diagnosis not present

## 2024-07-13 DIAGNOSIS — F419 Anxiety disorder, unspecified: Secondary | ICD-10-CM | POA: Diagnosis not present

## 2024-07-13 DIAGNOSIS — G4700 Insomnia, unspecified: Secondary | ICD-10-CM | POA: Diagnosis not present

## 2024-07-13 MED ORDER — SERTRALINE HCL 50 MG PO TABS
50.0000 mg | ORAL_TABLET | Freq: Every day | ORAL | 1 refills | Status: AC
  Filled 2024-07-13 – 2024-09-15 (×2): qty 90, 90d supply, fill #0

## 2024-07-13 MED ORDER — LISDEXAMFETAMINE DIMESYLATE 10 MG PO CAPS
10.0000 mg | ORAL_CAPSULE | Freq: Every day | ORAL | 0 refills | Status: DC
  Filled 2024-07-13: qty 30, 30d supply, fill #0

## 2024-07-13 NOTE — Progress Notes (Unsigned)
 Established Patient Office Visit  Subjective    Patient ID: Laurie Stout, female    DOB: [birth year]  Age: 33 y.o. MRN: [id number]  CC:  Chief Complaint  Patient presents with   Medical Management of Chronic Issues    Anxiety medication follow up     HPI Laurie Stout presents for follow up of anxiety/depression. She reports med compliance and denies acute complaints.   Outpatient Encounter Medications as of 07/13/2024  Medication Sig   cetirizine  (ZYRTEC  ALLERGY) 10 MG tablet Take 1 tablet (10 mg total) by mouth daily.   lisdexamfetamine (VYVANSE ) 10 MG capsule Take 1 capsule (10 mg total) by mouth daily.   ondansetron  (ZOFRAN -ODT) 8 MG disintegrating tablet Take 1 tablet (8 mg total) by mouth every 8 (eight) hours as needed for nausea or vomiting.   pseudoephedrine  (SUDAFED) 60 MG tablet Take 1 tablet (60 mg total) by mouth every 8 (eight) hours as needed for congestion.   traZODone  (DESYREL ) 50 MG tablet Take 1 tablet (50 mg total) by mouth at bedtime.   [DISCONTINUED] sertraline  (ZOLOFT ) 50 MG tablet Take 1 tablet (50 mg total) by mouth daily.   amoxicillin -clavulanate (AUGMENTIN ) 875-125 MG tablet Take 1 tablet by mouth 2 (two) times daily. (Patient not taking: Reported on 11/17/2023)   cyclobenzaprine  (FLEXERIL ) 10 MG tablet Take 1 tablet (10 mg total) by mouth at bedtime. (Patient not taking: Reported on 07/13/2024)   fluticasone (FLONASE) 50 MCG/ACT nasal spray Place into the nose. (Patient not taking: Reported on 07/13/2024)   medroxyPROGESTERone  (PROVERA ) 10 MG tablet Take 1 tablet (10 mg total) by mouth daily. Use for ten days (Patient not taking: Reported on 11/17/2023)   norgestimate -ethinyl estradiol  (ORTHO-CYCLEN) 0.25-35 MG-MCG tablet Take 1 tablet by mouth daily. (Patient not taking: Reported on 11/17/2023)   predniSONE  (DELTASONE ) 20 MG tablet Take 2 tablets daily with breakfast. (Patient not taking: Reported on 11/17/2023)   promethazine -dextromethorphan  (PROMETHAZINE -DM) 6.25-15 MG/5ML syrup Take 5 mLs by mouth 3 (three) times daily as needed for cough.   sertraline  (ZOLOFT ) 50 MG tablet Take 1 tablet (50 mg total) by mouth daily.   No facility-administered encounter medications on file as of 07/13/2024.    Past Medical History:  Diagnosis Date   Broken finger 2009   right pinkey - ortman    History reviewed. No pertinent surgical history.  Family History  Problem Relation Age of Onset   Diabetes Other     Social History   Socioeconomic History   Marital status: Single    Spouse name: Not on file   Number of children: Not on file   Years of education: Not on file   Highest education level: Bachelor's degree (e.g., BA, AB, BS)  Occupational History   Not on file  Tobacco Use   Smoking status: Never   Smokeless tobacco: Never  Vaping Use   Vaping status: Never Used  Substance and Sexual Activity   Alcohol use: Not Currently   Drug use: Never   Sexual activity: Not Currently    Partners: Female    Birth control/protection: None  Other Topics Concern   Not on file  Social History Narrative   hh of 6   Entering 12 th grade   [employer]   Sleep 8 hours or more   Pets 1 dog and fish         Social Drivers of Corporate investment banker Strain: Low Risk  (11/17/2023)   Overall Financial Resource Strain (CARDIA)  Difficulty of Paying Living Expenses: Not very hard  Food Insecurity: No Food Insecurity (11/17/2023)   Hunger Vital Sign    Worried About Running Out of Food in the Last Year: Never true    Ran Out of Food in the Last Year: Never true  Transportation Needs: No Transportation Needs (11/17/2023)   PRAPARE - Administrator, Civil Service (Medical): No    Lack of Transportation (Non-Medical): No  Physical Activity: Sufficiently Active (11/17/2023)   Exercise Vital Sign    Days of Exercise per Week: 3 days    Minutes of Exercise per Session: 60 min  Recent Concern: Physical Activity -  Insufficiently Active (08/20/2023)   Exercise Vital Sign    Days of Exercise per Week: 2 days    Minutes of Exercise per Session: 30 min  Stress: Stress Concern Present (11/17/2023)   [employer] of Occupational Health - Occupational Stress Questionnaire    Feeling of Stress : Very much  Social Connections: Socially Isolated (11/17/2023)   Social Connection and Isolation Panel    Frequency of Communication with Friends and Family: Three times a week    Frequency of Social Gatherings with Friends and Family: Three times a week    Attends Religious Services: Never    Active Member of Clubs or Organizations: No    Attends Banker Meetings: Never    Marital Status: Never married  Intimate Partner Violence: Not At Risk (08/20/2023)   Humiliation, Afraid, Rape, and Kick questionnaire    Fear of Current or Ex-Partner: No    Emotionally Abused: No    Physically Abused: No    Sexually Abused: No    Review of Systems  Psychiatric/Behavioral:  The patient has insomnia.   All other systems reviewed and are negative.       Objective    BP 133/83   Pulse 66   Ht 5' 6 (1.676 m)   Wt 199 lb (90.3 kg)   LMP 06/22/2024 (Approximate)   SpO2 97%   BMI 32.12 kg/m   Physical Exam Vitals and nursing note reviewed.  Constitutional:      General: She is not in acute distress. Cardiovascular:     Rate and Rhythm: Normal rate and regular rhythm.  Pulmonary:     Effort: Pulmonary effort is normal.     Breath sounds: Normal breath sounds.  Neurological:     General: No focal deficit present.     Mental Status: She is alert and oriented to person, place, and time.  Psychiatric:        Mood and Affect: Mood and affect normal.         Assessment & Plan:  1. Anxiety and depression (Primary) Meds refilled continue   2. Insomnia, unspecified type Discussed sleep hygiene.     No follow-ups on file.   Tanda Raguel SQUIBB, MD

## 2024-07-14 ENCOUNTER — Encounter: Payer: Self-pay | Admitting: Family Medicine

## 2024-08-02 DIAGNOSIS — F902 Attention-deficit hyperactivity disorder, combined type: Secondary | ICD-10-CM | POA: Diagnosis not present

## 2024-08-02 DIAGNOSIS — F419 Anxiety disorder, unspecified: Secondary | ICD-10-CM | POA: Diagnosis not present

## 2024-08-02 DIAGNOSIS — Z79899 Other long term (current) drug therapy: Secondary | ICD-10-CM | POA: Diagnosis not present

## 2024-09-08 ENCOUNTER — Other Ambulatory Visit (HOSPITAL_BASED_OUTPATIENT_CLINIC_OR_DEPARTMENT_OTHER): Payer: Self-pay

## 2024-09-08 MED ORDER — LISDEXAMFETAMINE DIMESYLATE 10 MG PO CAPS
10.0000 mg | ORAL_CAPSULE | Freq: Every day | ORAL | 0 refills | Status: DC
  Filled 2024-09-08: qty 30, 30d supply, fill #0

## 2024-09-15 ENCOUNTER — Other Ambulatory Visit (HOSPITAL_BASED_OUTPATIENT_CLINIC_OR_DEPARTMENT_OTHER): Payer: Self-pay

## 2024-11-16 ENCOUNTER — Other Ambulatory Visit (HOSPITAL_BASED_OUTPATIENT_CLINIC_OR_DEPARTMENT_OTHER): Payer: Self-pay

## 2024-11-16 MED ORDER — LISDEXAMFETAMINE DIMESYLATE 10 MG PO CAPS
10.0000 mg | ORAL_CAPSULE | Freq: Every day | ORAL | 0 refills | Status: AC
  Filled 2024-11-16: qty 30, 30d supply, fill #0
